# Patient Record
Sex: Female | Born: 1935 | Race: Black or African American | Hispanic: No | Smoking: Former smoker
Health system: Southern US, Community
[De-identification: ages and names within clinical notes are randomized; demographics above are authoritative.]

## PROBLEM LIST (undated history)

## (undated) DIAGNOSIS — K589 Irritable bowel syndrome without diarrhea: Secondary | ICD-10-CM

## (undated) DIAGNOSIS — E785 Hyperlipidemia, unspecified: Secondary | ICD-10-CM

## (undated) DIAGNOSIS — G8929 Other chronic pain: Secondary | ICD-10-CM

## (undated) DIAGNOSIS — F0390 Unspecified dementia without behavioral disturbance: Secondary | ICD-10-CM

## (undated) DIAGNOSIS — C50919 Malignant neoplasm of unspecified site of unspecified female breast: Secondary | ICD-10-CM

## (undated) DIAGNOSIS — M549 Dorsalgia, unspecified: Secondary | ICD-10-CM

## (undated) DIAGNOSIS — M542 Cervicalgia: Secondary | ICD-10-CM

## (undated) DIAGNOSIS — H269 Unspecified cataract: Secondary | ICD-10-CM

## (undated) HISTORY — PX: MASTECTOMY: SHX3

---

## 2003-07-22 ENCOUNTER — Emergency Department (HOSPITAL_COMMUNITY): Admission: EM | Admit: 2003-07-22 | Discharge: 2003-07-22 | Payer: Self-pay | Admitting: Emergency Medicine

## 2004-06-15 ENCOUNTER — Ambulatory Visit (HOSPITAL_COMMUNITY): Admission: RE | Admit: 2004-06-15 | Discharge: 2004-06-15 | Payer: Self-pay | Admitting: Family Medicine

## 2005-12-02 ENCOUNTER — Emergency Department (HOSPITAL_COMMUNITY): Admission: EM | Admit: 2005-12-02 | Discharge: 2005-12-02 | Payer: Self-pay | Admitting: Emergency Medicine

## 2007-01-15 ENCOUNTER — Telehealth (INDEPENDENT_AMBULATORY_CARE_PROVIDER_SITE_OTHER): Payer: Self-pay | Admitting: *Deleted

## 2007-01-15 ENCOUNTER — Ambulatory Visit: Payer: Self-pay | Admitting: Family Medicine

## 2007-01-15 DIAGNOSIS — K589 Irritable bowel syndrome without diarrhea: Secondary | ICD-10-CM | POA: Insufficient documentation

## 2007-01-15 DIAGNOSIS — K219 Gastro-esophageal reflux disease without esophagitis: Secondary | ICD-10-CM | POA: Insufficient documentation

## 2007-01-15 DIAGNOSIS — G609 Hereditary and idiopathic neuropathy, unspecified: Secondary | ICD-10-CM | POA: Insufficient documentation

## 2007-01-15 DIAGNOSIS — H269 Unspecified cataract: Secondary | ICD-10-CM | POA: Insufficient documentation

## 2007-01-15 DIAGNOSIS — F411 Generalized anxiety disorder: Secondary | ICD-10-CM | POA: Insufficient documentation

## 2007-01-15 DIAGNOSIS — Z853 Personal history of malignant neoplasm of breast: Secondary | ICD-10-CM

## 2007-01-15 DIAGNOSIS — M129 Arthropathy, unspecified: Secondary | ICD-10-CM | POA: Insufficient documentation

## 2007-01-15 DIAGNOSIS — J45909 Unspecified asthma, uncomplicated: Secondary | ICD-10-CM | POA: Insufficient documentation

## 2007-01-15 DIAGNOSIS — K5900 Constipation, unspecified: Secondary | ICD-10-CM | POA: Insufficient documentation

## 2007-01-15 DIAGNOSIS — J309 Allergic rhinitis, unspecified: Secondary | ICD-10-CM | POA: Insufficient documentation

## 2007-01-21 ENCOUNTER — Telehealth (INDEPENDENT_AMBULATORY_CARE_PROVIDER_SITE_OTHER): Payer: Self-pay | Admitting: Family Medicine

## 2007-01-21 ENCOUNTER — Ambulatory Visit (HOSPITAL_COMMUNITY): Admission: RE | Admit: 2007-01-21 | Discharge: 2007-01-21 | Payer: Self-pay | Admitting: Family Medicine

## 2007-01-21 ENCOUNTER — Encounter (INDEPENDENT_AMBULATORY_CARE_PROVIDER_SITE_OTHER): Payer: Self-pay | Admitting: Family Medicine

## 2007-01-22 ENCOUNTER — Telehealth (INDEPENDENT_AMBULATORY_CARE_PROVIDER_SITE_OTHER): Payer: Self-pay | Admitting: *Deleted

## 2007-01-22 LAB — CONVERTED CEMR LAB
ALT: 13 units/L (ref 0–35)
AST: 24 units/L (ref 0–37)
Albumin: 4.2 g/dL (ref 3.5–5.2)
Alkaline Phosphatase: 65 units/L (ref 39–117)
BUN: 16 mg/dL (ref 6–23)
CO2: 29 meq/L (ref 19–32)
Calcium: 9.2 mg/dL (ref 8.4–10.5)
Chloride: 104 meq/L (ref 96–112)
Cholesterol: 175 mg/dL (ref 0–200)
Creatinine, Ser: 0.68 mg/dL (ref 0.40–1.20)
Glucose, Bld: 80 mg/dL (ref 70–99)
HDL: 55 mg/dL (ref >39)
LDL Cholesterol: 101 mg/dL — ABNORMAL HIGH (ref 0–99)
Potassium: 4.1 meq/L (ref 3.5–5.3)
Sodium: 145 meq/L (ref 135–145)
TSH: 1.49 microintl units/mL (ref 0.350–5.50)
Total Bilirubin: 0.6 mg/dL (ref 0.3–1.2)
Total CHOL/HDL Ratio: 3.2
Total Protein: 7.1 g/dL (ref 6.0–8.3)
Triglycerides: 97 mg/dL (ref <150)
VLDL: 19 mg/dL (ref 0–40)

## 2007-01-26 ENCOUNTER — Encounter (INDEPENDENT_AMBULATORY_CARE_PROVIDER_SITE_OTHER): Payer: Self-pay | Admitting: Family Medicine

## 2007-01-27 ENCOUNTER — Telehealth (INDEPENDENT_AMBULATORY_CARE_PROVIDER_SITE_OTHER): Payer: Self-pay | Admitting: *Deleted

## 2007-01-27 ENCOUNTER — Ambulatory Visit: Payer: Self-pay | Admitting: Family Medicine

## 2007-01-27 LAB — CONVERTED CEMR LAB
Basophils Absolute: 0 10*3/uL (ref 0.0–0.1)
Basophils Relative: 1 % (ref 0–1)
Cholesterol, target level: 200 mg/dL
Eosinophils Absolute: 0.1 10*3/uL (ref 0.0–0.7)
Eosinophils Relative: 4 % (ref 0–5)
HCT: 43.3 % (ref 36.0–46.0)
HDL goal, serum: 40 mg/dL
Hemoglobin: 14 g/dL (ref 12.0–15.0)
LDL Goal: 160 mg/dL
Lymphocytes Relative: 42 % (ref 12–46)
Lymphs Abs: 1.4 10*3/uL (ref 0.7–3.3)
MCHC: 32.3 g/dL (ref 30.0–36.0)
MCV: 98.4 fL (ref 78.0–100.0)
Monocytes Absolute: 0.5 10*3/uL (ref 0.2–0.7)
Monocytes Relative: 14 % — ABNORMAL HIGH (ref 3–11)
Neutro Abs: 1.3 10*3/uL — ABNORMAL LOW (ref 1.7–7.7)
Neutrophils Relative %: 40 % — ABNORMAL LOW (ref 43–77)
Platelets: 202 10*3/uL (ref 150–400)
RBC: 4.4 M/uL (ref 3.87–5.11)
RDW: 13.6 % (ref 11.5–14.0)
WBC: 3.3 10*3/uL — ABNORMAL LOW (ref 4.0–10.5)

## 2007-01-28 ENCOUNTER — Ambulatory Visit (HOSPITAL_COMMUNITY): Admission: RE | Admit: 2007-01-28 | Discharge: 2007-01-28 | Payer: Self-pay | Admitting: Family Medicine

## 2007-01-29 ENCOUNTER — Telehealth (INDEPENDENT_AMBULATORY_CARE_PROVIDER_SITE_OTHER): Payer: Self-pay | Admitting: Family Medicine

## 2007-02-10 ENCOUNTER — Ambulatory Visit: Payer: Self-pay | Admitting: Family Medicine

## 2007-02-10 ENCOUNTER — Telehealth (INDEPENDENT_AMBULATORY_CARE_PROVIDER_SITE_OTHER): Payer: Self-pay | Admitting: *Deleted

## 2007-02-11 ENCOUNTER — Encounter (INDEPENDENT_AMBULATORY_CARE_PROVIDER_SITE_OTHER): Payer: Self-pay | Admitting: Family Medicine

## 2007-02-16 ENCOUNTER — Ambulatory Visit (HOSPITAL_COMMUNITY): Admission: RE | Admit: 2007-02-16 | Discharge: 2007-02-16 | Payer: Self-pay | Admitting: Family Medicine

## 2007-02-17 ENCOUNTER — Encounter (INDEPENDENT_AMBULATORY_CARE_PROVIDER_SITE_OTHER): Payer: Self-pay | Admitting: Family Medicine

## 2007-02-17 ENCOUNTER — Telehealth (INDEPENDENT_AMBULATORY_CARE_PROVIDER_SITE_OTHER): Payer: Self-pay | Admitting: *Deleted

## 2007-02-18 ENCOUNTER — Telehealth (INDEPENDENT_AMBULATORY_CARE_PROVIDER_SITE_OTHER): Payer: Self-pay | Admitting: *Deleted

## 2007-02-18 ENCOUNTER — Encounter (INDEPENDENT_AMBULATORY_CARE_PROVIDER_SITE_OTHER): Payer: Self-pay | Admitting: Family Medicine

## 2007-02-18 DIAGNOSIS — M4800 Spinal stenosis, site unspecified: Secondary | ICD-10-CM | POA: Insufficient documentation

## 2007-11-26 IMAGING — CR DG CERVICAL SPINE COMPLETE 4+V
7 series · 7 of 7 positions shown · non-contrast
Comparison: none

CLINICAL DATA: Neck pain.  Right arm radiculopathy.  
 CERVICAL SPINE ? 5 VIEW:

[view not recorded (1 of 7)]
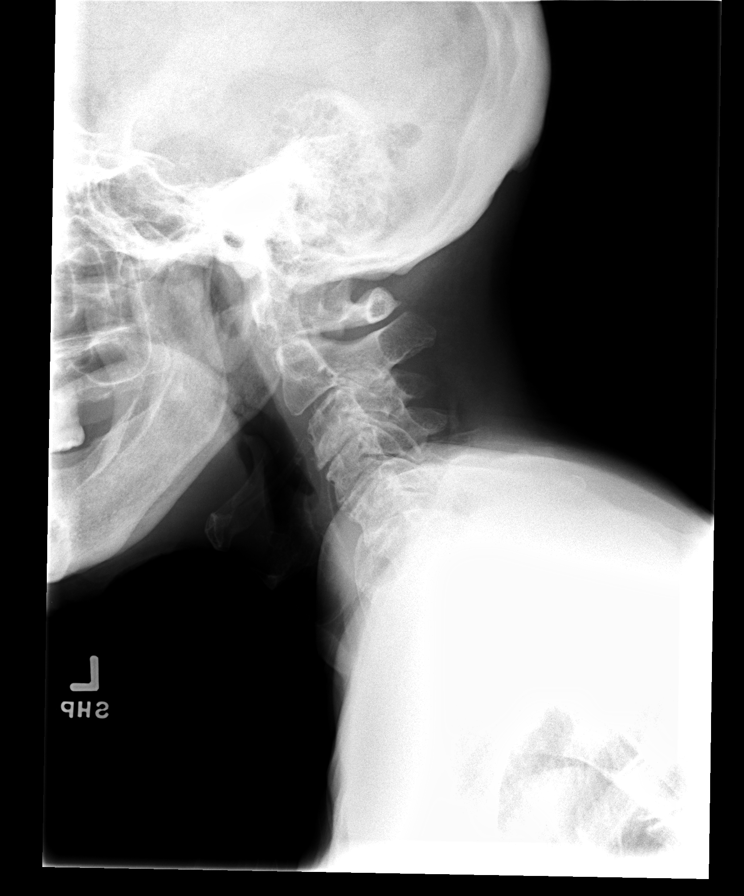

[view not recorded (2 of 7)]
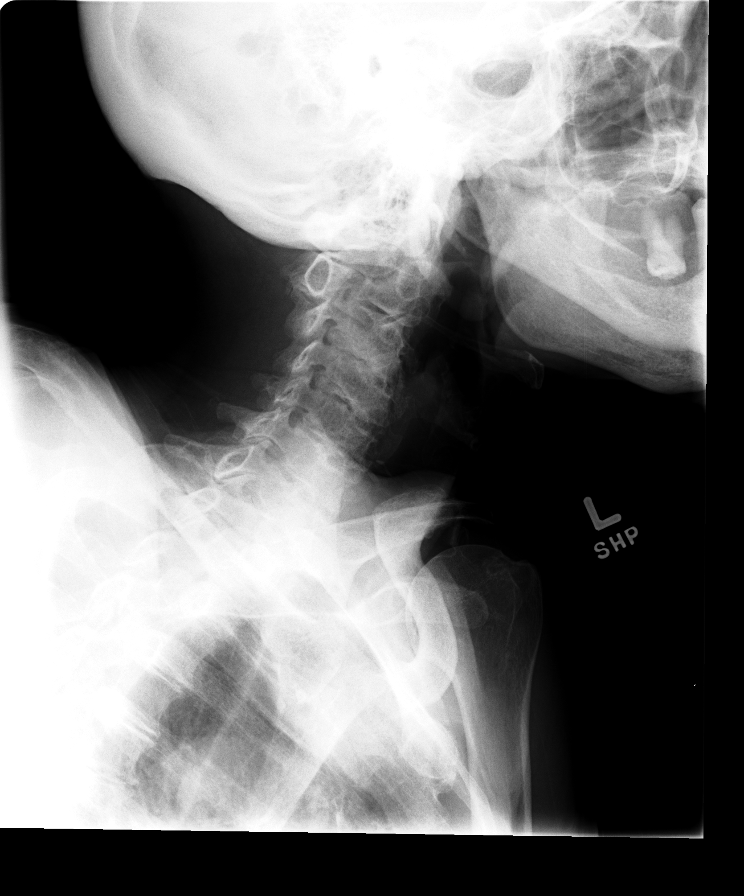

[view not recorded (3 of 7)]
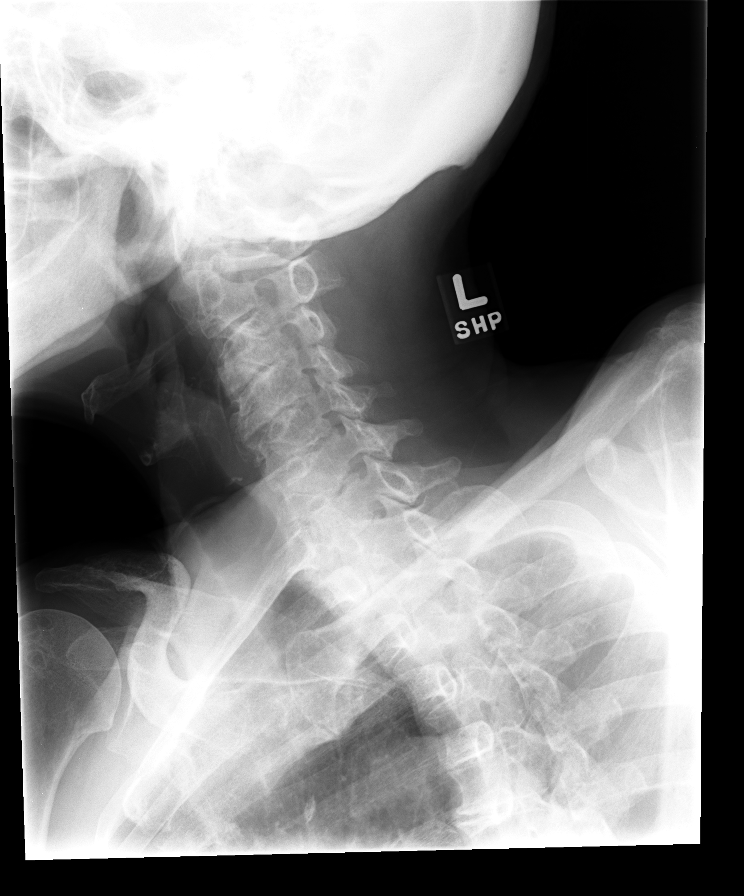

[view not recorded (4 of 7)]
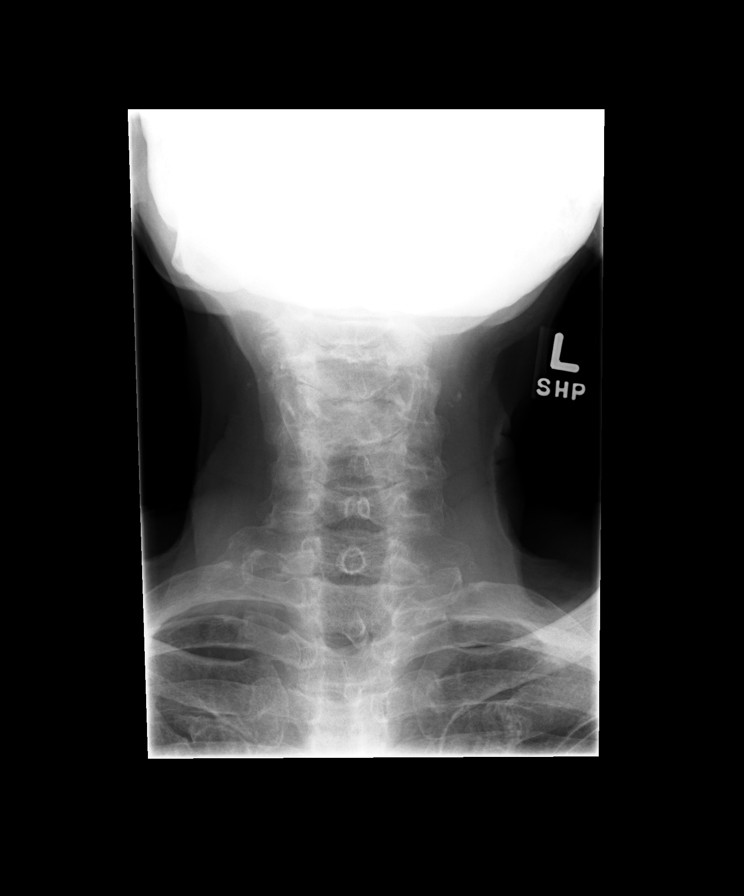

[view not recorded (5 of 7)]
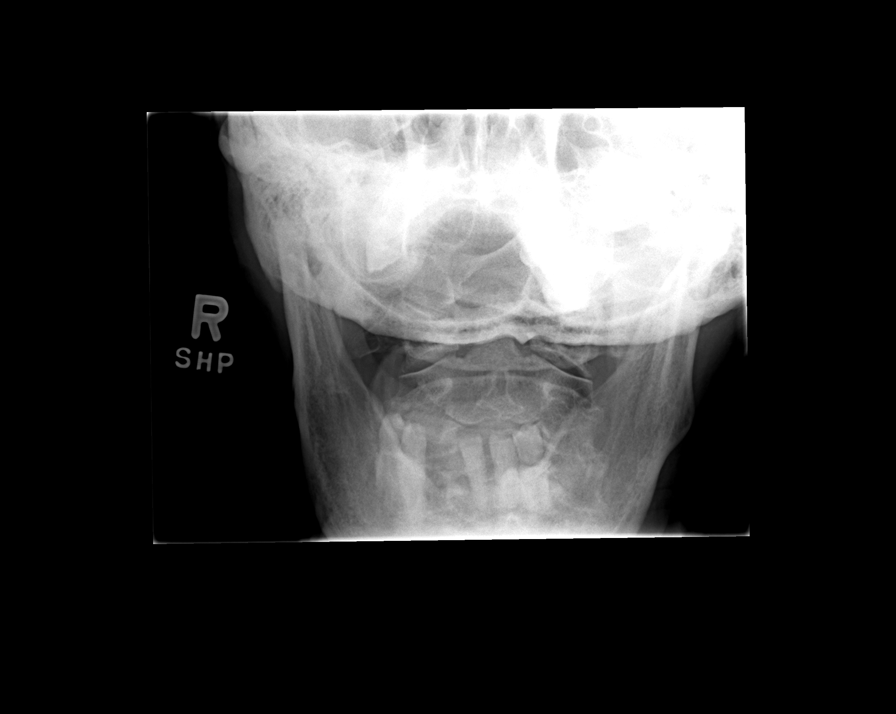

[view not recorded (6 of 7)]
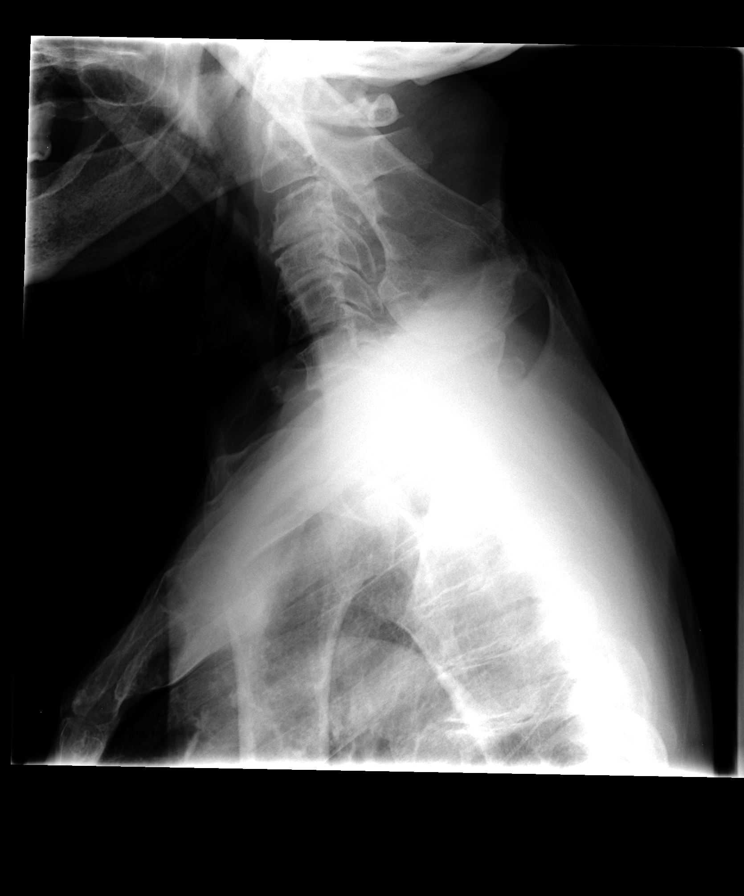

[view not recorded (7 of 7)]
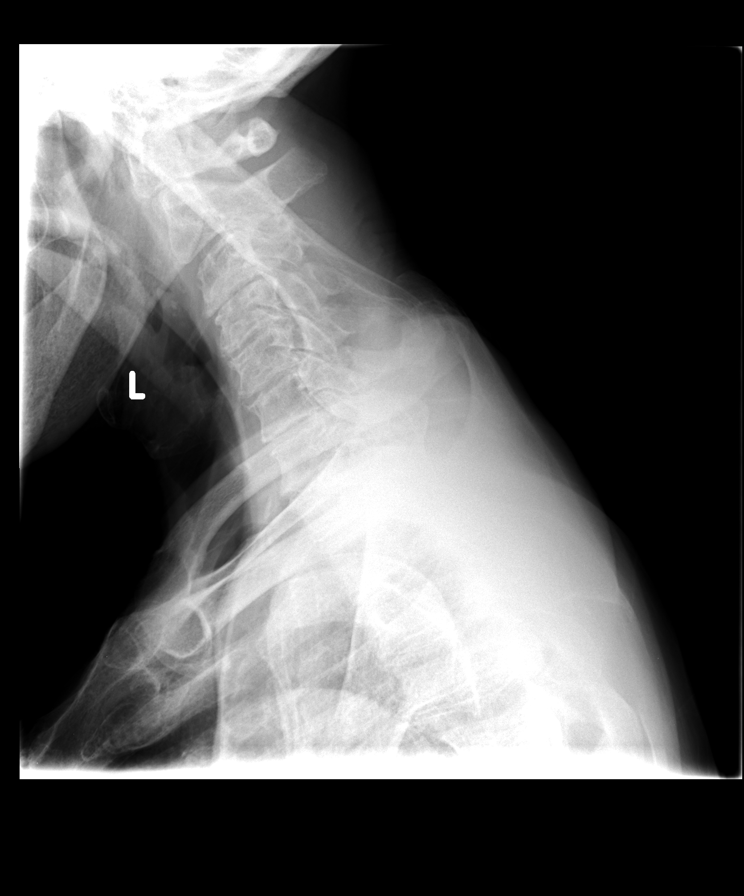

[7 of 7 positions shown; findings below may reference images not displayed]

FINDINGS: There is straightening of the cervical spine.  There is degenerative spondylosis from C2-3 through C7-T1 with disk space narrowing and posteriorly projecting osteophytes.  This is most pronounced at C3-4.  There is osteophytic encroachment upon the foramina bilaterally, most pronounced at C3-4 and C4-5.
IMPRESSION: Cervical spondylosis most pronounced at C3-4 without osteophytic encroachment upon the spinal canal and neural foramina.  Lesser but potentially significant changes also at C2-3 and C4-5.

## 2008-01-11 ENCOUNTER — Ambulatory Visit (HOSPITAL_COMMUNITY): Admission: RE | Admit: 2008-01-11 | Discharge: 2008-01-11 | Payer: Self-pay | Admitting: Family Medicine

## 2008-01-18 ENCOUNTER — Encounter (INDEPENDENT_AMBULATORY_CARE_PROVIDER_SITE_OTHER): Payer: Self-pay | Admitting: Family Medicine

## 2008-01-28 ENCOUNTER — Ambulatory Visit: Payer: Self-pay | Admitting: Family Medicine

## 2008-01-28 LAB — CONVERTED CEMR LAB

## 2008-01-29 ENCOUNTER — Encounter (INDEPENDENT_AMBULATORY_CARE_PROVIDER_SITE_OTHER): Payer: Self-pay | Admitting: Family Medicine

## 2008-02-01 LAB — CONVERTED CEMR LAB
ALT: 13 units/L (ref 0–35)
AST: 27 units/L (ref 0–37)
Albumin: 4.2 g/dL (ref 3.5–5.2)
Alkaline Phosphatase: 53 units/L (ref 39–117)
BUN: 20 mg/dL (ref 6–23)
Basophils Absolute: 0 10*3/uL (ref 0.0–0.1)
Basophils Relative: 1 % (ref 0–1)
CO2: 21 meq/L (ref 19–32)
Calcium: 9.2 mg/dL (ref 8.4–10.5)
Chloride: 108 meq/L (ref 96–112)
Creatinine, Ser: 0.78 mg/dL (ref 0.40–1.20)
Eosinophils Absolute: 0.1 10*3/uL (ref 0.0–0.7)
Eosinophils Relative: 4 % (ref 0–5)
Glucose, Bld: 70 mg/dL (ref 70–99)
HCT: 40.8 % (ref 36.0–46.0)
Hemoglobin: 13.5 g/dL (ref 12.0–15.0)
Lymphocytes Relative: 39 % (ref 12–46)
Lymphs Abs: 1.2 10*3/uL (ref 0.7–4.0)
MCHC: 33.1 g/dL (ref 30.0–36.0)
MCV: 96.2 fL (ref 78.0–100.0)
Monocytes Absolute: 0.4 10*3/uL (ref 0.1–1.0)
Monocytes Relative: 15 % — ABNORMAL HIGH (ref 3–12)
Neutro Abs: 1.3 10*3/uL — ABNORMAL LOW (ref 1.7–7.7)
Neutrophils Relative %: 42 % — ABNORMAL LOW (ref 43–77)
Platelets: 159 10*3/uL (ref 150–400)
Potassium: 4.5 meq/L (ref 3.5–5.3)
RBC: 4.24 M/uL (ref 3.87–5.11)
RDW: 14 % (ref 11.5–15.5)
Sodium: 145 meq/L (ref 135–145)
TSH: 1.329 microintl units/mL (ref 0.350–4.50)
Total Bilirubin: 0.5 mg/dL (ref 0.3–1.2)
Total Protein: 7 g/dL (ref 6.0–8.3)
WBC: 3 10*3/uL — ABNORMAL LOW (ref 4.0–10.5)

## 2008-02-11 ENCOUNTER — Ambulatory Visit: Payer: Self-pay | Admitting: Family Medicine

## 2008-02-11 DIAGNOSIS — H9190 Unspecified hearing loss, unspecified ear: Secondary | ICD-10-CM | POA: Insufficient documentation

## 2008-02-12 ENCOUNTER — Telehealth (INDEPENDENT_AMBULATORY_CARE_PROVIDER_SITE_OTHER): Payer: Self-pay | Admitting: *Deleted

## 2008-05-12 ENCOUNTER — Ambulatory Visit: Payer: Self-pay | Admitting: Family Medicine

## 2008-08-11 ENCOUNTER — Encounter (INDEPENDENT_AMBULATORY_CARE_PROVIDER_SITE_OTHER): Payer: Self-pay | Admitting: Family Medicine

## 2008-09-06 ENCOUNTER — Ambulatory Visit: Payer: Self-pay | Admitting: Family Medicine

## 2008-09-06 DIAGNOSIS — F4321 Adjustment disorder with depressed mood: Secondary | ICD-10-CM | POA: Insufficient documentation

## 2008-09-08 ENCOUNTER — Encounter (INDEPENDENT_AMBULATORY_CARE_PROVIDER_SITE_OTHER): Payer: Self-pay | Admitting: Family Medicine

## 2008-09-12 ENCOUNTER — Encounter (INDEPENDENT_AMBULATORY_CARE_PROVIDER_SITE_OTHER): Payer: Self-pay | Admitting: Family Medicine

## 2008-10-10 ENCOUNTER — Ambulatory Visit: Payer: Self-pay | Admitting: Family Medicine

## 2009-01-10 ENCOUNTER — Ambulatory Visit: Payer: Self-pay | Admitting: Family Medicine

## 2009-01-11 ENCOUNTER — Encounter (INDEPENDENT_AMBULATORY_CARE_PROVIDER_SITE_OTHER): Payer: Self-pay | Admitting: *Deleted

## 2009-01-11 LAB — CONVERTED CEMR LAB
ALT: 42 units/L — ABNORMAL HIGH (ref 0–35)
AST: 34 units/L (ref 0–37)
Albumin: 4.3 g/dL (ref 3.5–5.2)
Alkaline Phosphatase: 68 units/L (ref 39–117)
BUN: 13 mg/dL (ref 6–23)
Basophils Absolute: 0 10*3/uL (ref 0.0–0.1)
Basophils Relative: 0 % (ref 0–1)
CO2: 25 meq/L (ref 19–32)
Calcium: 9.5 mg/dL (ref 8.4–10.5)
Chloride: 104 meq/L (ref 96–112)
Creatinine, Ser: 0.7 mg/dL (ref 0.40–1.20)
Eosinophils Absolute: 0.1 10*3/uL (ref 0.0–0.7)
Eosinophils Relative: 3 % (ref 0–5)
Glucose, Bld: 112 mg/dL — ABNORMAL HIGH (ref 70–99)
HCT: 37.8 % (ref 36.0–46.0)
Hemoglobin: 12.6 g/dL (ref 12.0–15.0)
Lymphocytes Relative: 47 % — ABNORMAL HIGH (ref 12–46)
Lymphs Abs: 1.5 10*3/uL (ref 0.7–4.0)
MCHC: 33.3 g/dL (ref 30.0–36.0)
MCV: 94.5 fL (ref 78.0–100.0)
Monocytes Absolute: 0.3 10*3/uL (ref 0.1–1.0)
Monocytes Relative: 8 % (ref 3–12)
Neutro Abs: 1.3 10*3/uL — ABNORMAL LOW (ref 1.7–7.7)
Neutrophils Relative %: 42 % — ABNORMAL LOW (ref 43–77)
Platelets: 153 10*3/uL (ref 150–400)
Potassium: 4.1 meq/L (ref 3.5–5.3)
RBC: 4 M/uL (ref 3.87–5.11)
RDW: 13.3 % (ref 11.5–15.5)
Sodium: 143 meq/L (ref 135–145)
TSH: 0.962 microintl units/mL (ref 0.350–4.500)
Total Bilirubin: 0.3 mg/dL (ref 0.3–1.2)
Total Protein: 6.9 g/dL (ref 6.0–8.3)
WBC: 3.1 10*3/uL — ABNORMAL LOW (ref 4.0–10.5)

## 2009-01-12 ENCOUNTER — Encounter (INDEPENDENT_AMBULATORY_CARE_PROVIDER_SITE_OTHER): Payer: Self-pay | Admitting: Family Medicine

## 2009-01-17 ENCOUNTER — Encounter (INDEPENDENT_AMBULATORY_CARE_PROVIDER_SITE_OTHER): Payer: Self-pay | Admitting: *Deleted

## 2009-04-04 ENCOUNTER — Ambulatory Visit: Payer: Self-pay | Admitting: Family Medicine

## 2009-04-04 DIAGNOSIS — D72819 Decreased white blood cell count, unspecified: Secondary | ICD-10-CM | POA: Insufficient documentation

## 2009-04-04 DIAGNOSIS — R740 Nonspecific elevation of levels of transaminase and lactic acid dehydrogenase [LDH]: Secondary | ICD-10-CM

## 2009-04-05 ENCOUNTER — Encounter (INDEPENDENT_AMBULATORY_CARE_PROVIDER_SITE_OTHER): Payer: Self-pay | Admitting: Family Medicine

## 2009-04-06 LAB — CONVERTED CEMR LAB
ALT: 13 units/L (ref 0–35)
AST: 23 units/L (ref 0–37)
Albumin: 4 g/dL (ref 3.5–5.2)
Alkaline Phosphatase: 61 units/L (ref 39–117)
Basophils Absolute: 0 10*3/uL (ref 0.0–0.1)
Basophils Relative: 1 % (ref 0–1)
Bilirubin, Direct: 0.1 mg/dL (ref 0.0–0.3)
Eosinophils Absolute: 0.1 10*3/uL (ref 0.0–0.7)
Eosinophils Relative: 3 % (ref 0–5)
HCT: 38 % (ref 36.0–46.0)
Hemoglobin: 12.9 g/dL (ref 12.0–15.0)
Indirect Bilirubin: 0.3 mg/dL (ref 0.0–0.9)
Lymphocytes Relative: 50 % — ABNORMAL HIGH (ref 12–46)
Lymphs Abs: 1.7 10*3/uL (ref 0.7–4.0)
MCHC: 33.9 g/dL (ref 30.0–36.0)
MCV: 93.4 fL (ref 78.0–100.0)
Monocytes Absolute: 0.4 10*3/uL (ref 0.1–1.0)
Monocytes Relative: 12 % (ref 3–12)
Neutro Abs: 1.2 10*3/uL — ABNORMAL LOW (ref 1.7–7.7)
Neutrophils Relative %: 35 % — ABNORMAL LOW (ref 43–77)
Platelets: 158 10*3/uL (ref 150–400)
RBC: 4.07 M/uL (ref 3.87–5.11)
RDW: 13.1 % (ref 11.5–15.5)
Total Bilirubin: 0.4 mg/dL (ref 0.3–1.2)
Total Protein: 6.7 g/dL (ref 6.0–8.3)
WBC: 3.4 10*3/uL — ABNORMAL LOW (ref 4.0–10.5)

## 2009-04-28 ENCOUNTER — Encounter (HOSPITAL_COMMUNITY): Admission: RE | Admit: 2009-04-28 | Discharge: 2009-05-28 | Payer: Self-pay | Admitting: Oncology

## 2009-04-28 ENCOUNTER — Ambulatory Visit (HOSPITAL_COMMUNITY): Payer: Self-pay | Admitting: Oncology

## 2009-11-23 ENCOUNTER — Ambulatory Visit (HOSPITAL_COMMUNITY): Payer: Self-pay | Admitting: Oncology

## 2009-11-23 ENCOUNTER — Encounter (HOSPITAL_COMMUNITY): Admission: RE | Admit: 2009-11-23 | Discharge: 2009-12-23 | Payer: Self-pay | Admitting: Oncology

## 2010-08-04 ENCOUNTER — Encounter: Payer: Self-pay | Admitting: Family Medicine

## 2010-08-05 ENCOUNTER — Encounter: Payer: Self-pay | Admitting: Family Medicine

## 2010-08-06 ENCOUNTER — Encounter: Payer: Self-pay | Admitting: Family Medicine

## 2010-10-02 LAB — DIFFERENTIAL
Basophils Absolute: 0 10*3/uL (ref 0.0–0.1)
Basophils Relative: 1 % (ref 0–1)
Eosinophils Absolute: 0.2 10*3/uL (ref 0.0–0.7)
Eosinophils Relative: 5 % (ref 0–5)
Lymphocytes Relative: 43 % (ref 12–46)
Lymphs Abs: 1.5 10*3/uL (ref 0.7–4.0)
Monocytes Absolute: 0.5 10*3/uL (ref 0.1–1.0)
Monocytes Relative: 13 % — ABNORMAL HIGH (ref 3–12)
Neutro Abs: 1.3 10*3/uL — ABNORMAL LOW (ref 1.7–7.7)
Neutrophils Relative %: 38 % — ABNORMAL LOW (ref 43–77)

## 2010-10-02 LAB — CBC
HCT: 38.1 % (ref 36.0–46.0)
Hemoglobin: 13.4 g/dL (ref 12.0–15.0)
MCHC: 35.2 g/dL (ref 30.0–36.0)
MCV: 93.9 fL (ref 78.0–100.0)
Platelets: 187 10*3/uL (ref 150–400)
RBC: 4.06 MIL/uL (ref 3.87–5.11)
RDW: 13.6 % (ref 11.5–15.5)
WBC: 3.5 10*3/uL — ABNORMAL LOW (ref 4.0–10.5)

## 2010-10-18 LAB — CBC
MCHC: 34.6 g/dL (ref 30.0–36.0)
RBC: 4.39 MIL/uL (ref 3.87–5.11)
RDW: 13.7 % (ref 11.5–15.5)

## 2010-10-18 LAB — DIFFERENTIAL
Basophils Absolute: 0 10*3/uL (ref 0.0–0.1)
Basophils Relative: 0 % (ref 0–1)
Lymphocytes Relative: 12 % (ref 12–46)
Monocytes Relative: 11 % (ref 3–12)
Neutro Abs: 4.4 10*3/uL (ref 1.7–7.7)
Neutrophils Relative %: 75 % (ref 43–77)

## 2010-10-18 LAB — TSH: TSH: 0.774 u[IU]/mL (ref 0.350–4.500)

## 2010-10-18 LAB — RPR: RPR Ser Ql: NONREACTIVE

## 2010-10-18 LAB — VITAMIN B12: Vitamin B-12: 827 pg/mL (ref 211–911)

## 2011-09-13 ENCOUNTER — Telehealth: Payer: Self-pay | Admitting: *Deleted

## 2011-09-13 NOTE — Telephone Encounter (Signed)
Waiting on Md input from call of pts spouse 12:28 PM

## 2014-07-14 ENCOUNTER — Ambulatory Visit (INDEPENDENT_AMBULATORY_CARE_PROVIDER_SITE_OTHER): Payer: Medicare Other | Admitting: Otolaryngology

## 2014-07-14 DIAGNOSIS — H6123 Impacted cerumen, bilateral: Secondary | ICD-10-CM

## 2014-07-14 DIAGNOSIS — H903 Sensorineural hearing loss, bilateral: Secondary | ICD-10-CM

## 2014-11-11 ENCOUNTER — Inpatient Hospital Stay (HOSPITAL_COMMUNITY)
Admission: EM | Admit: 2014-11-11 | Discharge: 2014-11-16 | DRG: 948 | Disposition: A | Discharge status: Skilled Nursing Facility | Payer: Medicare Other | Attending: Family Medicine | Admitting: Family Medicine

## 2014-11-11 ENCOUNTER — Emergency Department (HOSPITAL_COMMUNITY): Payer: Medicare Other

## 2014-11-11 ENCOUNTER — Encounter (HOSPITAL_COMMUNITY): Payer: Self-pay

## 2014-11-11 DIAGNOSIS — R945 Abnormal results of liver function studies: Secondary | ICD-10-CM

## 2014-11-11 DIAGNOSIS — Z853 Personal history of malignant neoplasm of breast: Secondary | ICD-10-CM

## 2014-11-11 DIAGNOSIS — Z800 Family history of malignant neoplasm of digestive organs: Secondary | ICD-10-CM

## 2014-11-11 DIAGNOSIS — Z87891 Personal history of nicotine dependence: Secondary | ICD-10-CM

## 2014-11-11 DIAGNOSIS — D696 Thrombocytopenia, unspecified: Secondary | ICD-10-CM | POA: Diagnosis present

## 2014-11-11 DIAGNOSIS — K529 Noninfective gastroenteritis and colitis, unspecified: Secondary | ICD-10-CM | POA: Diagnosis present

## 2014-11-11 DIAGNOSIS — R4182 Altered mental status, unspecified: Secondary | ICD-10-CM | POA: Diagnosis not present

## 2014-11-11 DIAGNOSIS — K8020 Calculus of gallbladder without cholecystitis without obstruction: Secondary | ICD-10-CM | POA: Diagnosis present

## 2014-11-11 DIAGNOSIS — K2100 Gastro-esophageal reflux disease with esophagitis, without bleeding: Secondary | ICD-10-CM

## 2014-11-11 DIAGNOSIS — K7589 Other specified inflammatory liver diseases: Secondary | ICD-10-CM

## 2014-11-11 DIAGNOSIS — R509 Fever, unspecified: Secondary | ICD-10-CM | POA: Diagnosis present

## 2014-11-11 DIAGNOSIS — R7989 Other specified abnormal findings of blood chemistry: Secondary | ICD-10-CM | POA: Insufficient documentation

## 2014-11-11 DIAGNOSIS — E785 Hyperlipidemia, unspecified: Secondary | ICD-10-CM | POA: Diagnosis present

## 2014-11-11 HISTORY — DX: Malignant neoplasm of unspecified site of unspecified female breast: C50.919

## 2014-11-11 HISTORY — DX: Hyperlipidemia, unspecified: E78.5

## 2014-11-11 LAB — URINALYSIS, ROUTINE W REFLEX MICROSCOPIC
Glucose, UA: NEGATIVE mg/dL
Ketones, ur: NEGATIVE mg/dL
LEUKOCYTES UA: NEGATIVE
Nitrite: NEGATIVE
PH: 8 (ref 5.0–8.0)
Protein, ur: 30 mg/dL — AB
Specific Gravity, Urine: 1.02 (ref 1.005–1.030)
UROBILINOGEN UA: 4 mg/dL — AB (ref 0.0–1.0)

## 2014-11-11 LAB — BASIC METABOLIC PANEL
ANION GAP: 7 (ref 5–15)
BUN: 15 mg/dL (ref 6–23)
CALCIUM: 9.3 mg/dL (ref 8.4–10.5)
CO2: 29 mmol/L (ref 19–32)
CREATININE: 0.6 mg/dL (ref 0.50–1.10)
Chloride: 104 mmol/L (ref 96–112)
GFR calc non Af Amer: 80 mL/min — ABNORMAL LOW (ref >90)
Glucose, Bld: 139 mg/dL — ABNORMAL HIGH (ref 70–99)
POTASSIUM: 4.4 mmol/L (ref 3.5–5.1)
SODIUM: 140 mmol/L (ref 135–145)

## 2014-11-11 LAB — URINE MICROSCOPIC-ADD ON

## 2014-11-11 NOTE — ED Notes (Signed)
Patient resting quietly in bed with eyes closed.  Patient's son called to check on her.

## 2014-11-11 NOTE — ED Notes (Signed)
Pt in by ems from [facility] assisted [facility] facility.   Per staff members, pt with change in mental status, more confused today.  Pt is awake and alert.

## 2014-11-11 NOTE — ED Provider Notes (Addendum)
CSN: [id number]     Arrival date & time 11/11/14  2231 History   First MD Initiated Contact with Patient 11/11/14 2252     Chief Complaint  Patient presents with  . Altered Mental Status     (Consider location/radiation/quality/duration/timing/severity/associated sxs/prior Treatment) HPI  Level 5 Caveat: altered mental status. This is an 81 year old female who was sent from her assisted living facility because she was more confused today than her baseline. The facility did not report what her baseline mental status is. She was noted to have a temperature of 99.9 orally on arrival. She was noted by nursing staff to be soiled with feces on arrival.  Past Medical History  Diagnosis Date  . Hyperlipidemia   . Cancer   . Breast cancer    Past Surgical History  Procedure Laterality Date  . Mastectomy     No family history on file. History  Substance Use Topics  . Smoking status: Former Research scientist (life sciences)  . Smokeless tobacco: Not on file  . Alcohol Use: No   OB History    No data available     Review of Systems  Unable to perform ROS   Allergies  Aspirin and Penicillins  Home Medications   Prior to Admission medications   Medication Sig Start Date End Date Taking? Authorizing Provider  omeprazole (PRILOSEC) 20 MG capsule Take 20 mg by mouth daily. 10/20/14   Historical Provider, MD  pravastatin (PRAVACHOL) 40 MG tablet Take 40 mg by mouth daily. 10/20/14   Historical Provider, MD   BP 135/59 mmHg  Pulse 84  Temp(Src) 101.2 F (38.4 C) (Rectal)  Resp 16  Wt 125 lb (56.7 kg)  SpO2 98%   Physical Exam  General: Well-developed, thin female in no acute distress; appearance consistent with age of record HENT: normocephalic; atraumatic Eyes: pupils equal, round and reactive to light; arcus senilis bilaterally Neck: supple Heart: regular rate and rhythm Lungs: clear to auscultation bilaterally Abdomen: soft; nondistended; nontender; no masses or hepatosplenomegaly; bowel sounds  present Extremities: Arthritic changes; pulses normal Neurologic: Awake, alert and oriented x 1; inappropriate answers to questions; motor function intact in all extremities and symmetric; no facial droop Skin: Warm and dry Psychiatric: Flat affect    ED Course  Procedures (including critical care time)   MDM   Nursing notes and vitals signs, including pulse oximetry, reviewed.  Summary of this visit's results, reviewed by myself:  Labs:  Results for orders placed or performed during the hospital encounter of 11/11/14 (from the past 24 hour(s))  CBC with Differential/Platelet     Status: Abnormal   Collection Time: 11/11/14 11:14 PM  Result Value Ref Range   WBC 7.7 4.0 - 10.5 K/uL   RBC 3.89 3.87 - 5.11 MIL/uL   Hemoglobin 12.4 12.0 - 15.0 g/dL   HCT 37.4 36.0 - 46.0 %   MCV 96.1 78.0 - 100.0 fL   MCH 31.9 26.0 - 34.0 pg   MCHC 33.2 30.0 - 36.0 g/dL   RDW 13.3 11.5 - 15.5 %   Platelets 35 (L) 150 - 400 K/uL   Neutrophils Relative % 83 (H) 43 - 77 %   Neutro Abs 6.6 1.7 - 7.7 K/uL   Lymphocytes Relative 4 (L) 12 - 46 %   Lymphs Abs 0.3 (L) 0.7 - 4.0 K/uL   Monocytes Relative 13 (H) 3 - 12 %   Monocytes Absolute 1.0 0.1 - 1.0 K/uL   Eosinophils Relative 0 0 - 5 %   Eosinophils  Absolute 0.0 0.0 - 0.7 K/uL   Basophils Relative 0 0 - 1 %   Basophils Absolute 0.0 0.0 - 0.1 K/uL  Basic metabolic panel     Status: Abnormal   Collection Time: 11/11/14 11:14 PM  Result Value Ref Range   Sodium 140 135 - 145 mmol/L   Potassium 4.4 3.5 - 5.1 mmol/L   Chloride 104 96 - 112 mmol/L   CO2 29 19 - 32 mmol/L   Glucose, Bld 139 (H) 70 - 99 mg/dL   BUN 15 6 - 23 mg/dL   Creatinine, Ser 0.60 0.50 - 1.10 mg/dL   Calcium 9.3 8.4 - 10.5 mg/dL   GFR calc non Af Amer 80 (L) >90 mL/min   GFR calc Af Amer >90 >90 mL/min   Anion gap 7 5 - 15  Urinalysis, Routine w reflex microscopic     Status: Abnormal   Collection Time: 11/11/14 11:20 PM  Result Value Ref Range   Color, Urine YELLOW  YELLOW   APPearance CLEAR CLEAR   Specific Gravity, Urine 1.020 1.005 - 1.030   pH 8.0 5.0 - 8.0   Glucose, UA NEGATIVE NEGATIVE mg/dL   Hgb urine dipstick TRACE (A) NEGATIVE   Bilirubin Urine SMALL (A) NEGATIVE   Ketones, ur NEGATIVE NEGATIVE mg/dL   Protein, ur 30 (A) NEGATIVE mg/dL   Urobilinogen, UA 4.0 (H) 0.0 - 1.0 mg/dL   Nitrite NEGATIVE NEGATIVE   Leukocytes, UA NEGATIVE NEGATIVE  Urine microscopic-add on     Status: Abnormal   Collection Time: 11/11/14 11:20 PM  Result Value Ref Range   Squamous Epithelial / LPF FEW (A) RARE   WBC, UA 0-2 <3 WBC/hpf   RBC / HPF 3-6 <3 RBC/hpf   Bacteria, UA FEW (A) RARE  Blood culture (routine x 2)     Status: None (Preliminary result)   Collection Time: 11/12/14 12:51 AM  Result Value Ref Range   Specimen Description LEFT ANTECUBITAL    Special Requests BOTTLES DRAWN AEROBIC ONLY 6CC AEB    Culture PENDING    Report Status PENDING   I-Stat CG4 Lactic Acid, ED     Status: Abnormal   Collection Time: 11/12/14  1:06 AM  Result Value Ref Range   Lactic Acid, Venous 2.80 (HH) 0.5 - 2.0 mmol/L   Comment NOTIFIED PHYSICIAN     Imaging Studies: Ct Head Wo Contrast  11/12/2014   CLINICAL DATA:  Acute onset of altered mental status. Initial encounter.  EXAM: CT HEAD WITHOUT CONTRAST  TECHNIQUE: Contiguous axial images were obtained from the base of the skull through the vertex without intravenous contrast.  COMPARISON:  CT of the head performed 07/22/2003  FINDINGS: There is no evidence of acute infarction, mass lesion, or intra- or extra-axial hemorrhage on CT.  Prominence of the ventricles sulci reflects moderate cortical volume loss. Mild periventricular white matter change may reflect small vessel ischemic microangiopathy. Evaluation is mildly suboptimal due to motion artifact.  The brainstem and fourth ventricle are within normal limits. The basal ganglia are unremarkable in appearance. The cerebral hemispheres demonstrate grossly normal  gray-white differentiation. No mass effect or midline shift is seen.  There is no evidence of fracture; visualized osseous structures are unremarkable in appearance. The visualized portions of the orbits are within normal limits. The paranasal sinuses and mastoid air cells are well-aerated. No significant soft tissue abnormalities are seen.  IMPRESSION: 1. No acute intracranial pathology seen on CT. 2. Moderate cortical volume loss and scattered small vessel  ischemic microangiopathy.   Electronically Signed   By: Garald Balding M.D.   On: 11/12/2014 01:39   Dg Chest Port 1 View  11/11/2014   CLINICAL DATA:  Altered mental status.  History of breast cancer.  EXAM: PORTABLE CHEST - 1 VIEW  COMPARISON:  Chest radiograph January 28, 2007  FINDINGS: Cardiac silhouette is normal. Mildly calcified aortic knob. The lungs are clear without pleural effusions or focal consolidations. Trachea projects midline and there is no pneumothorax. Soft tissue planes and included osseous structures are non-suspicious.  IMPRESSION: No acute cardiopulmonary process.   Electronically Signed   By: Elon Alas   On: 11/11/2014 23:53   1:35 AM IV fluid bolus initiated for elevated lactate. Patient has not been hypotensive. Patient has leukocytosis but does have a lymphopenia; this may represent a viral illness.  1:48 AM Patient's mental status has improved after treatment of her fever. Hospitalist will admit.   Shanon Rosser, MD 11/12/14 8309  Shanon Rosser, MD 11/12/14 [phone]

## 2014-11-11 NOTE — ED Notes (Signed)
Patient arrived to ED by EMS. Soiled with extremely large new BM and old dry stool as well. Patient pants soiled placed in patient belonging bag. Patient cleaned up and changed so EDP can examine her.

## 2014-11-12 ENCOUNTER — Encounter (HOSPITAL_COMMUNITY): Payer: Self-pay | Admitting: Internal Medicine

## 2014-11-12 ENCOUNTER — Emergency Department (HOSPITAL_COMMUNITY): Payer: Medicare Other

## 2014-11-12 DIAGNOSIS — E785 Hyperlipidemia, unspecified: Secondary | ICD-10-CM | POA: Diagnosis present

## 2014-11-12 DIAGNOSIS — Z853 Personal history of malignant neoplasm of breast: Secondary | ICD-10-CM | POA: Diagnosis not present

## 2014-11-12 DIAGNOSIS — R4182 Altered mental status, unspecified: Secondary | ICD-10-CM | POA: Diagnosis present

## 2014-11-12 DIAGNOSIS — K529 Noninfective gastroenteritis and colitis, unspecified: Secondary | ICD-10-CM | POA: Diagnosis present

## 2014-11-12 DIAGNOSIS — D696 Thrombocytopenia, unspecified: Secondary | ICD-10-CM

## 2014-11-12 DIAGNOSIS — K8020 Calculus of gallbladder without cholecystitis without obstruction: Secondary | ICD-10-CM | POA: Diagnosis present

## 2014-11-12 DIAGNOSIS — Z87891 Personal history of nicotine dependence: Secondary | ICD-10-CM | POA: Diagnosis not present

## 2014-11-12 DIAGNOSIS — Z800 Family history of malignant neoplasm of digestive organs: Secondary | ICD-10-CM | POA: Diagnosis not present

## 2014-11-12 DIAGNOSIS — R509 Fever, unspecified: Secondary | ICD-10-CM | POA: Diagnosis present

## 2014-11-12 LAB — CBC WITH DIFFERENTIAL/PLATELET
BASOS ABS: 0 10*3/uL (ref 0.0–0.1)
BASOS PCT: 0 % (ref 0–1)
Basophils Absolute: 0 10*3/uL (ref 0.0–0.1)
Basophils Relative: 0 % (ref 0–1)
EOS ABS: 0 10*3/uL (ref 0.0–0.7)
EOS PCT: 0 % (ref 0–5)
Eosinophils Absolute: 0 10*3/uL (ref 0.0–0.7)
Eosinophils Relative: 0 % (ref 0–5)
HCT: 37.4 % (ref 36.0–46.0)
HCT: 36.6 % (ref 36.0–46.0)
Hemoglobin: 12.4 g/dL (ref 12.0–15.0)
Hemoglobin: 12.4 g/dL (ref 12.0–15.0)
LYMPHS PCT: 4 % — AB (ref 12–46)
Lymphocytes Relative: 7 % — ABNORMAL LOW (ref 12–46)
Lymphs Abs: 0.3 10*3/uL — ABNORMAL LOW (ref 0.7–4.0)
Lymphs Abs: 0.6 10*3/uL — ABNORMAL LOW (ref 0.7–4.0)
MCH: 31.9 pg (ref 26.0–34.0)
MCH: 32.5 pg (ref 26.0–34.0)
MCHC: 33.2 g/dL (ref 30.0–36.0)
MCHC: 33.9 g/dL (ref 30.0–36.0)
MCV: 96.1 fL (ref 78.0–100.0)
MCV: 95.8 fL (ref 78.0–100.0)
MONO ABS: 0.9 10*3/uL (ref 0.1–1.0)
MONOS PCT: 10 % (ref 3–12)
Monocytes Absolute: 1 10*3/uL (ref 0.1–1.0)
Monocytes Relative: 13 % — ABNORMAL HIGH (ref 3–12)
Neutro Abs: 6.6 10*3/uL (ref 1.7–7.7)
Neutro Abs: 7.7 10*3/uL (ref 1.7–7.7)
Neutrophils Relative %: 83 % — ABNORMAL HIGH (ref 43–77)
Neutrophils Relative %: 83 % — ABNORMAL HIGH (ref 43–77)
PLATELETS: 35 10*3/uL — AB (ref 150–400)
PLATELETS: 109 10*3/uL — AB (ref 150–400)
RBC: 3.89 MIL/uL (ref 3.87–5.11)
RBC: 3.82 MIL/uL — AB (ref 3.87–5.11)
RDW: 13.3 % (ref 11.5–15.5)
RDW: 13.2 % (ref 11.5–15.5)
WBC: 7.7 10*3/uL (ref 4.0–10.5)
WBC: 9.2 10*3/uL (ref 4.0–10.5)

## 2014-11-12 LAB — HEPATIC FUNCTION PANEL
ALK PHOS: 104 U/L (ref 39–117)
ALT: 569 U/L — AB (ref 0–35)
AST: 1069 U/L — AB (ref 0–37)
Albumin: 3.8 g/dL (ref 3.5–5.2)
BILIRUBIN DIRECT: 1.7 mg/dL — AB (ref 0.0–0.5)
BILIRUBIN TOTAL: 2.8 mg/dL — AB (ref 0.3–1.2)
Indirect Bilirubin: 1.1 mg/dL — ABNORMAL HIGH (ref 0.3–0.9)
Total Protein: 6.7 g/dL (ref 6.0–8.3)

## 2014-11-12 LAB — I-STAT CG4 LACTIC ACID, ED: LACTIC ACID, VENOUS: 2.8 mmol/L — AB (ref 0.5–2.0)

## 2014-11-12 LAB — TSH: TSH: 0.348 u[IU]/mL — ABNORMAL LOW (ref 0.350–4.500)

## 2014-11-12 MED ORDER — BOOST / RESOURCE BREEZE PO LIQD
1.0000 | Freq: Two times a day (BID) | ORAL | Status: DC
  Administered 2014-11-13 – 2014-11-16 (×6): 1 via ORAL

## 2014-11-12 MED ORDER — SODIUM CHLORIDE 0.9 % IV SOLN: Freq: Once | INTRAVENOUS | Status: DC

## 2014-11-12 MED ORDER — SODIUM CHLORIDE 0.9 % IV BOLUS (SEPSIS)
1000.0000 mL | Freq: Once | INTRAVENOUS | Status: AC
  Administered 2014-11-12: 1000 mL via INTRAVENOUS

## 2014-11-12 MED ORDER — ACETAMINOPHEN 325 MG PO TABS
650.0000 mg | ORAL_TABLET | Freq: Once | ORAL | Status: AC
  Administered 2014-11-12: 650 mg via ORAL
  Filled 2014-11-12: qty 2

## 2014-11-12 MED ORDER — KCL IN DEXTROSE-NACL 20-5-0.9 MEQ/L-%-% IV SOLN
INTRAVENOUS | Status: DC
  Administered 2014-11-12 – 2014-11-14 (×3): via INTRAVENOUS

## 2014-11-12 NOTE — Progress Notes (Signed)
Utilization review Completed Jericho Cieslik RN BSN   

## 2014-11-12 NOTE — ED Notes (Signed)
Dr. Marin Comment in to assess for admission.

## 2014-11-12 NOTE — Progress Notes (Signed)
INITIAL NUTRITION ASSESSMENT  DOCUMENTATION CODES Per approved criteria  -Not Applicable   INTERVENTION: -Resource Breeze po BID, each supplement provides 250 kcal and 9 grams of protein  NUTRITION DIAGNOSIS: Increased protein needs related to acute illness as evidenced by fever.   Goal: Pt to meet >/= 90% of their estimated nutrition needs    Monitor:  Oral intake, Diet tolerance, labs, fluid status, better weight/intake hx  Reason for Assessment: *MST  81 y.o. female  Admitting Dx: Altered mental status  ASSESSMENT: 81 y.o. female with hx of breast cancer, hyperlipidemia, hx of anemia, peripheral neuropathy, spinal stenosis, brought to the ER as she was having altered mental status.   Per notes: She has had n/v/d  No recent hx of weight loss.  Febrile, gastroenteritis, volume depleted  Nutrition Focused Physical Exam: Unable to complete  Height: Ht Readings from Last 1 Encounters:  04/04/09 5\' 4"  (1.626 m)    Weight: Wt Readings from Last 1 Encounters:  11/12/14 110 lb 14.4 oz (50.304 kg)    Ideal Body Weight: 120 lbs  % Ideal Body Weight: 92%  Wt Readings from Last 10 Encounters:  11/12/14 110 lb 14.4 oz (50.304 kg)  04/04/09 123 lb (55.792 kg)  01/10/09 118 lb (53.524 kg)  10/10/08 111 lb (50.349 kg)  09/06/08 108 lb (48.988 kg)  05/12/08 111 lb (50.349 kg)  02/11/08 107 lb (48.535 kg)  01/28/08 107 lb (48.535 kg)  02/10/07 109 lb (49.442 kg)  01/27/07 106 lb (48.081 kg)    Usual Body Weight: Unknown  BMI:  Body mass index is 19.03 kg/(m^2).  Estimated Nutritional Needs: Kcal: 1500-1650 (30-33 kcal/kg) Protein: 60-70 (1.2-1.4 kg) Fluid: 1.5-1.7 liters  Skin: WDL  Diet Order: Diet regular Room service appropriate?: Yes; Fluid consistency:: Thin  EDUCATION NEEDS: -No education needs identified at this time   Intake/Output Summary (Last 24 hours) at 11/12/14 0814 Last data filed at 11/12/14 0530  Gross per 24 hour  Intake    120 ml   Output     14 ml  Net    106 ml    Last BM: 4/28  Labs:   Recent Labs Lab 11/11/14 2314  NA 140  K 4.4  CL 104  CO2 29  BUN 15  CREATININE 0.60  CALCIUM 9.3  GLUCOSE 139*    CBG (last 3)  No results for input(s): GLUCAP in the last 72 hours.  Scheduled Meds:   Continuous Infusions: . dextrose 5 % and 0.9 % NaCl with KCl 20 mEq/L 75 mL/hr at 11/12/14 0709    Past Medical History  Diagnosis Date  . Hyperlipidemia   . Cancer   . Breast cancer     Past Surgical History  Procedure Laterality Date  . Mastectomy     Burtis Junes RD, LDN Nutrition Pager: [phone] 11/12/2014 8:14 AM

## 2014-11-12 NOTE — H&P (Signed)
[facility] History and Physical  Alexandra [name]:[id number] DOB: 1925-08-14    PCP:   Maricela Curet, MD   Chief Complaint: sent in from assisted living facility for altered mental status, nausea, vomiting and diarrhea.   HPI: Alexandra [name] is an 81 y.o. female with hx of breast cancer, hyperlipidemia, hx of anemia, peripheral neuropathy, spinal stenosis, brought to the ER as she was having altered mental status.  She was lethargic originally, but later became much more alert, though still confused and was not able to tell good history.  I called the assisted living and spoke with personels there.  She normally able to ambulate and converse meaningfully, but today was confused.  She had nausea, vomiting, no complaint of abdominal pain, and had diarrhea.  No one else at the facility had similar complaints.  She hadn't been started on new meds, and has not been on antibiotics recently.  In the ER, she was found to have a negative head CT, platelet count of 35K, and normal WBC.  Her lactic acid was slightly elevated and her UA and CXR were negative.  She did have a temp spike of 102.  She were pan cultured, and given IVF, and hospitalist was asked to admit her for altered mental status, GI symptoms, and thrombocytopenia.   Rewiew of Systems: Unable to obtain meaningful ROS.    Past Medical History  Diagnosis Date  . Hyperlipidemia   . Cancer   . Breast cancer     Past Surgical History  Procedure Laterality Date  . Mastectomy      Medications:  HOME MEDS: Prior to Admission medications   Medication Sig Start Date End Date Taking? Authorizing Provider  omeprazole (PRILOSEC) 20 MG capsule Take 20 mg by mouth daily. 10/20/14   Historical Provider, MD  pravastatin (PRAVACHOL) 40 MG tablet Take 40 mg by mouth daily. 10/20/14   Historical Provider, MD     Allergies:  Allergies  Allergen Reactions  . Aspirin     REACTION: "Messes up my heart and tells it to stop"  . Penicillins      REACTION: Pains and feels it messes her up    Social History:   reports that she has quit smoking. She does not have any smokeless tobacco history on file. She reports that she does not drink alcohol or use illicit drugs.  Family History: History reviewed. No pertinent family history.   Physical Exam: Filed Vitals:   11/11/14 2245 11/11/14 2343 11/12/14 0018 11/12/14 0111  BP: 138/66  134/63 135/59  Pulse: 85   84  Temp: 99.9 F (37.7 C) 102.3 F (39.1 C)  101.2 F (38.4 C)  TempSrc: Oral Rectal  Rectal  Resp: 18   16  Weight: 56.7 kg (125 lb)     SpO2: 100%   98%   Blood pressure 135/59, pulse 84, temperature 101.2 F (38.4 C), temperature source Rectal, resp. rate 16, weight 56.7 kg (125 lb), SpO2 98 %.  GEN:  Pleasant  patient lying in the stretcher in no acute distress; cooperative with exam. PSYCH:  alert and slightly confused; does not appear anxious or depressed; affect is appropriate. HEENT: Mucous membranes pink and anicteric; PERRLA; EOM intact; no cervical lymphadenopathy nor thyromegaly or carotid bruit; no JVD; There were no stridor. Neck is very supple. Breasts:: Not examined CHEST WALL: No tenderness CHEST: Normal respiration, clear to auscultation bilaterally.  HEART: Regular rate and rhythm.  There are no murmur, rub, or gallops.  BACK: No kyphosis or scoliosis; no CVA tenderness ABDOMEN: soft and non-tender; no masses, no organomegaly, normal abdominal bowel sounds; no pannus; no intertriginous candida. There is no rebound and no distention. Rectal Exam: Not done EXTREMITIES: No bone or joint deformity; age-appropriate arthropathy of the hands and knees; no edema; no ulcerations.  There is no calf tenderness. Genitalia: not examined PULSES: 2+ and symmetric SKIN: Normal hydration no rash or ulceration CNS: Cranial nerves 2-12 grossly intact no focal lateralizing neurologic deficit.  Speech is fluent; uvula elevated with phonation, facial symmetry and  tongue midline. DTR are normal bilaterally, cerebella exam is intact, barbinski is negative and strengths are equaled bilaterally.  No sensory loss.   Labs on Admission:  Basic Metabolic Panel:  Recent Labs Lab 11/11/14 2314  NA 140  K 4.4  CL 104  CO2 29  GLUCOSE 139*  BUN 15  CREATININE 0.60  CALCIUM 9.3   CBC:  Recent Labs Lab 11/11/14 2314  WBC 7.7  NEUTROABS 6.6  HGB 12.4  HCT 37.4  MCV 96.1  PLT 35*    Radiological Exams on Admission: Ct Head Wo Contrast  11/12/2014   CLINICAL DATA:  Acute onset of altered mental status. Initial encounter.  EXAM: CT HEAD WITHOUT CONTRAST  TECHNIQUE: Contiguous axial images were obtained from the base of the skull through the vertex without intravenous contrast.  COMPARISON:  CT of the head performed 07/22/2003  FINDINGS: There is no evidence of acute infarction, mass lesion, or intra- or extra-axial hemorrhage on CT.  Prominence of the ventricles sulci reflects moderate cortical volume loss. Mild periventricular white matter change may reflect small vessel ischemic microangiopathy. Evaluation is mildly suboptimal due to motion artifact.  The brainstem and fourth ventricle are within normal limits. The basal ganglia are unremarkable in appearance. The cerebral hemispheres demonstrate grossly normal gray-white differentiation. No mass effect or midline shift is seen.  There is no evidence of fracture; visualized osseous structures are unremarkable in appearance. The visualized portions of the orbits are within normal limits. The paranasal sinuses and mastoid air cells are well-aerated. No significant soft tissue abnormalities are seen.  IMPRESSION: 1. No acute intracranial pathology seen on CT. 2. Moderate cortical volume loss and scattered small vessel ischemic microangiopathy.   Electronically Signed   By: Garald Balding M.D.   On: 11/12/2014 01:39   Dg Chest Port 1 View  11/11/2014   CLINICAL DATA:  Altered mental status.  History of breast  cancer.  EXAM: PORTABLE CHEST - 1 VIEW  COMPARISON:  Chest radiograph January 28, 2007  FINDINGS: Cardiac silhouette is normal. Mildly calcified aortic knob. The lungs are clear without pleural effusions or focal consolidations. Trachea projects midline and there is no pneumothorax. Soft tissue planes and included osseous structures are non-suspicious.  IMPRESSION: No acute cardiopulmonary process.   Electronically Signed   By: Elon Alas   On: 11/11/2014 23:53    Assessment/Plan Present on Admission:  . Altered mental status . Gastroenteritis . Fever  PLAN:  Will admit her for further work up.  Will send her stool for C diff and stool study, and keep enteric precaution.  I don't know why her platelet count is low, and will repeat it.  If persistent, will consider Heme/Onc consultation.  She has an infection, and may well have a viral gastroenteritis.   She was confused, but is doing better, and will follow clinically.  She appears volume depleted, and will be given some IVF.  She is otherwise  stable, full code, and will be admitted to Dr Cindie Laroche per prior arrangement.  Thank you for allowing me to participate in the care of your nice patient.   Other plans as per orders.  Code Status: FULL Haskel Khan, MD. [facility] Pager [phone] 7pm to 7am.  11/12/2014, 2:11 AM

## 2014-11-13 LAB — COMPREHENSIVE METABOLIC PANEL
ALBUMIN: 3 g/dL — AB (ref 3.5–5.0)
ALT: 319 U/L — AB (ref 14–54)
ANION GAP: 4 — AB (ref 5–15)
AST: 306 U/L — AB (ref 15–41)
Alkaline Phosphatase: 93 U/L (ref 38–126)
BILIRUBIN TOTAL: 1.8 mg/dL — AB (ref 0.3–1.2)
BUN: 8 mg/dL (ref 6–20)
CHLORIDE: 109 mmol/L (ref 101–111)
CO2: 28 mmol/L (ref 22–32)
Calcium: 8.3 mg/dL — ABNORMAL LOW (ref 8.9–10.3)
Creatinine, Ser: 0.6 mg/dL (ref 0.44–1.00)
GFR calc Af Amer: >60 mL/min (ref >60)
GFR calc non Af Amer: >60 mL/min (ref >60)
Glucose, Bld: 92 mg/dL (ref 70–99)
POTASSIUM: 3.8 mmol/L (ref 3.5–5.1)
Sodium: 141 mmol/L (ref 135–145)
Total Protein: 5.5 g/dL — ABNORMAL LOW (ref 6.5–8.1)

## 2014-11-13 LAB — CBC
HEMATOCRIT: 31.9 % — AB (ref 36.0–46.0)
Hemoglobin: 10.9 g/dL — ABNORMAL LOW (ref 12.0–15.0)
MCH: 32.6 pg (ref 26.0–34.0)
MCHC: 34.2 g/dL (ref 30.0–36.0)
MCV: 95.5 fL (ref 78.0–100.0)
Platelets: 95 10*3/uL — ABNORMAL LOW (ref 150–400)
RBC: 3.34 MIL/uL — ABNORMAL LOW (ref 3.87–5.11)
RDW: 13.4 % (ref 11.5–15.5)
WBC: 4.6 10*3/uL (ref 4.0–10.5)

## 2014-11-13 LAB — PROTIME-INR
INR: 1.28 (ref 0.00–1.49)
Prothrombin Time: 16.2 seconds — ABNORMAL HIGH (ref 11.6–15.2)

## 2014-11-13 NOTE — Progress Notes (Signed)
Alexandra [name] ASN:[id number] DOB: 1925-12-27 DOA: 11/11/2014 PCP: Maricela Curet, MD   Subjective: This lady was admitted with altered mental status. An first name, the patient is unable to give me any clear history this morning. She appears to be alert but wonders in our conversation.           Physical Exam: Blood pressure 119/67, pulse 79, temperature 98.3 F (36.8 C), temperature source Oral, resp. rate 18, weight 50.304 kg (110 lb 14.4 oz), SpO2 96 %. Somewhat confused but alert. There are no focal neurological signs. Lung fields are clear. Heart sounds are present without murmurs or added sounds. Her abdomen is soft but slightly tender in a generalized fashion. Bowel sounds are clearly heard.   Investigations:  Recent Results (from the past 240 hour(s))  Blood culture (routine x 2)     Status: None (Preliminary result)   Collection Time: 11/12/14 12:51 AM  Result Value Ref Range Status   Specimen Description LEFT ANTECUBITAL  Final   Special Requests BOTTLES DRAWN AEROBIC ONLY 6CC AEB  Final   Culture NO GROWTH <24 HRS  Final   Report Status PENDING  Incomplete  Blood culture (routine x 2)     Status: None (Preliminary result)   Collection Time: 11/12/14  2:01 AM  Result Value Ref Range Status   Specimen Description LEFT ANTECUBITAL  Final   Special Requests BOTTLES DRAWN AEROBIC ONLY 4CC  Final   Culture NO GROWTH <24 HRS  Final   Report Status PENDING  Incomplete     Basic Metabolic Panel:  Recent Labs  11/11/14 2314 11/13/14 0658  NA 140 141  K 4.4 3.8  CL 104 109  CO2 29 28  GLUCOSE 139* 92  BUN 15 8  CREATININE 0.60 0.60  CALCIUM 9.3 8.3*   Liver Function Tests:  Recent Labs  11/12/14 0201 11/13/14 0658  AST 1069* 306*  ALT 569* 319*  ALKPHOS 104 93  BILITOT 2.8* 1.8*  PROT 6.7 5.5*  ALBUMIN 3.8 3.0*     CBC:  Recent Labs  11/11/14 2314 11/12/14 0201 11/13/14 0658  WBC 7.7 9.2 4.6  NEUTROABS 6.6 7.7  --   HGB 12.4 12.4 10.9*   HCT 37.4 36.6 31.9*  MCV 96.1 95.8 95.5  PLT 35* 109* 95*    Ct Head Wo Contrast  11/12/2014   CLINICAL DATA:  Acute onset of altered mental status. Initial encounter.  EXAM: CT HEAD WITHOUT CONTRAST  TECHNIQUE: Contiguous axial images were obtained from the base of the skull through the vertex without intravenous contrast.  COMPARISON:  CT of the head performed 07/22/2003  FINDINGS: There is no evidence of acute infarction, mass lesion, or intra- or extra-axial hemorrhage on CT.  Prominence of the ventricles sulci reflects moderate cortical volume loss. Mild periventricular white matter change may reflect small vessel ischemic microangiopathy. Evaluation is mildly suboptimal due to motion artifact.  The brainstem and fourth ventricle are within normal limits. The basal ganglia are unremarkable in appearance. The cerebral hemispheres demonstrate grossly normal gray-white differentiation. No mass effect or midline shift is seen.  There is no evidence of fracture; visualized osseous structures are unremarkable in appearance. The visualized portions of the orbits are within normal limits. The paranasal sinuses and mastoid air cells are well-aerated. No significant soft tissue abnormalities are seen.  IMPRESSION: 1. No acute intracranial pathology seen on CT. 2. Moderate cortical volume loss and scattered small vessel ischemic microangiopathy.   Electronically Signed   By: Jacqulynn Cadet  Chang M.D.   On: 11/12/2014 01:39   Dg Chest Port 1 View  11/11/2014   CLINICAL DATA:  Altered mental status.  History of breast cancer.  EXAM: PORTABLE CHEST - 1 VIEW  COMPARISON:  Chest radiograph January 28, 2007  FINDINGS: Cardiac silhouette is normal. Mildly calcified aortic knob. The lungs are clear without pleural effusions or focal consolidations. Trachea projects midline and there is no pneumothorax. Soft tissue planes and included osseous structures are non-suspicious.  IMPRESSION: No acute cardiopulmonary process.    Electronically Signed   By: Elon Alas   On: 11/11/2014 23:53      Medications: I have reviewed the patient's current medications.  Impression: 1. Altered mental status. Etiology is unclear to me. She does not appear to have any clear source of infection. We will obtain MRI brain scan for further evaluation. 2. Elevated liver enzymes. Her AST was significantly raised at 1069 yesterday but appears to be improved today at 306. Her ALT was at 569 but today it is at 319. Her bilirubin was increased at 2.8 but is down to 1.8. The alkaline phosphatase has remained normal throughout so this appears to be a hepatitic picture. 3. Fever. She does not appear to have a fever now. 4. Gastroenteritis. I think this is probably the source of her problems and it is not clear whether she still having diarrhea or not. Her abdomen is slightly tender but bowel sounds are heard and I think this is reflective of her gastroenteritis that  is probably improving.      Consultants:  None.   Procedures:  None.   Antibiotics:  None.                   Code Status: Full code.  Family Communication: No family members at the bedside.   Disposition Plan: Depending on progress.  Time spent: 15 minutes.   LOS: 1 day   [place] C   11/13/2014, 7:53 AM

## 2014-11-14 ENCOUNTER — Inpatient Hospital Stay (HOSPITAL_COMMUNITY): Payer: Medicare Other

## 2014-11-14 ENCOUNTER — Encounter (HOSPITAL_COMMUNITY): Payer: Self-pay | Admitting: Gastroenterology

## 2014-11-14 DIAGNOSIS — R7989 Other specified abnormal findings of blood chemistry: Secondary | ICD-10-CM | POA: Insufficient documentation

## 2014-11-14 DIAGNOSIS — R945 Abnormal results of liver function studies: Secondary | ICD-10-CM

## 2014-11-14 DIAGNOSIS — R4182 Altered mental status, unspecified: Principal | ICD-10-CM

## 2014-11-14 LAB — COMPREHENSIVE METABOLIC PANEL
ALBUMIN: 3.2 g/dL — AB (ref 3.5–5.0)
ALT: 256 U/L — AB (ref 14–54)
ANION GAP: 4 — AB (ref 5–15)
AST: 165 U/L — ABNORMAL HIGH (ref 15–41)
Alkaline Phosphatase: 119 U/L (ref 38–126)
BUN: 7 mg/dL (ref 6–20)
CO2: 31 mmol/L (ref 22–32)
Calcium: 8.9 mg/dL (ref 8.9–10.3)
Chloride: 110 mmol/L (ref 101–111)
Creatinine, Ser: 0.56 mg/dL (ref 0.44–1.00)
GFR calc Af Amer: >60 mL/min (ref >60)
GFR calc non Af Amer: >60 mL/min (ref >60)
Glucose, Bld: 89 mg/dL (ref 70–99)
POTASSIUM: 3.7 mmol/L (ref 3.5–5.1)
SODIUM: 145 mmol/L (ref 135–145)
TOTAL PROTEIN: 6 g/dL — AB (ref 6.5–8.1)
Total Bilirubin: 1.2 mg/dL (ref 0.3–1.2)

## 2014-11-14 LAB — CBC
HCT: 36.6 % (ref 36.0–46.0)
HEMATOCRIT: 35.2 % — AB (ref 36.0–46.0)
HEMOGLOBIN: 12 g/dL (ref 12.0–15.0)
Hemoglobin: 12.4 g/dL (ref 12.0–15.0)
MCH: 32.3 pg (ref 26.0–34.0)
MCH: 32.1 pg (ref 26.0–34.0)
MCHC: 34.1 g/dL (ref 30.0–36.0)
MCHC: 33.9 g/dL (ref 30.0–36.0)
MCV: 94.6 fL (ref 78.0–100.0)
MCV: 94.8 fL (ref 78.0–100.0)
Platelets: 114 10*3/uL — ABNORMAL LOW (ref 150–400)
Platelets: 117 10*3/uL — ABNORMAL LOW (ref 150–400)
RBC: 3.72 MIL/uL — AB (ref 3.87–5.11)
RBC: 3.86 MIL/uL — ABNORMAL LOW (ref 3.87–5.11)
RDW: 13.3 % (ref 11.5–15.5)
RDW: 13.3 % (ref 11.5–15.5)
WBC: 3.9 10*3/uL — AB (ref 4.0–10.5)
WBC: 3.6 10*3/uL — ABNORMAL LOW (ref 4.0–10.5)

## 2014-11-14 LAB — VITAMIN B12: VITAMIN B 12: 1032 pg/mL — AB (ref 180–914)

## 2014-11-14 LAB — SEDIMENTATION RATE: Sed Rate: 17 mm/hr (ref 0–22)

## 2014-11-14 LAB — FOLATE: Folate: 58.6 ng/mL (ref >5.9)

## 2014-11-14 LAB — TSH: TSH: 0.717 u[IU]/mL (ref 0.350–4.500)

## 2014-11-14 LAB — AMMONIA: Ammonia: 22 umol/L (ref 9–35)

## 2014-11-14 LAB — PROTIME-INR
INR: 1.06 (ref 0.00–1.49)
Prothrombin Time: 13.9 seconds (ref 11.6–15.2)

## 2014-11-14 NOTE — Consult Note (Signed)
Referring Provider: Dr. Cindie Laroche Primary Care Physician:  Maricela Curet, MD Primary Gastroenterologist:  Dr. Gala Romney   Date of Admission: 11/11/14 Date of Consultation: 11/14/14  Reason for Consultation:  Elevated LFTs  HPI:  Alexandra [name] is an 81 y.o. year old female who was admitted with altered mental status. She is a poor historian, and history was unable to be accurately obtained from patient. Medical records reviewed for information. Appears she also had nausea, vomiting, and diarrhea. No further vomiting or diarrhea since admission. She was tolerating her meal when seen in consultation. Tmax of 102, with pan cultures completed. LFTs elevated with AST 1069, ALT 569, Tbili 2.8, direct bilrubin 1.7, indirect 1.1. LFTs have continued to improve since admission with rapid decline of transaminases. US abdomen revealed multiple gallstones, minimal gallbladder wall thickening present. Unable to exclude cholecystitis. Per Audrea Muscat, patient is now at baseline.   Past Medical History  Diagnosis Date  . Hyperlipidemia   . Cancer   . Breast cancer     Past Surgical History  Procedure Laterality Date  . Mastectomy      Prior to Admission medications   Medication Sig Start Date End Date Taking? Authorizing Provider  Multiple Vitamin (MULTIVITAMIN WITH MINERALS) TABS tablet Take 1 tablet by mouth daily.   Yes Historical Provider, MD  omeprazole (PRILOSEC) 20 MG capsule Take 20 mg by mouth daily. 10/20/14  Yes Historical Provider, MD  pravastatin (PRAVACHOL) 40 MG tablet Take 40 mg by mouth daily. 10/20/14  Yes Historical Provider, MD    Current Facility-Administered Medications  Medication Dose Route Frequency Provider Last Rate Last Dose  . dextrose 5 % and 0.9 % NaCl with KCl 20 mEq/L infusion   Intravenous Continuous Orvan Falconer, MD 75 mL/hr at 11/13/14 1021    . feeding supplement (RESOURCE BREEZE) (RESOURCE BREEZE) liquid 1 Container  1 Container Oral BID BM Oswaldo Milian, RD   1 Container  at 11/14/14 1400    Allergies as of 11/11/2014 - Review Complete 11/11/2014  Allergen Reaction Noted  . Aspirin  09/06/2008  . Penicillins  01/15/2007    Family History  Problem Relation Age of Onset  . Colon cancer      unknown    History   Social History  . Marital Status: Widowed    Spouse Name: N/A  . Number of Children: N/A  . Years of Education: N/A   Occupational History  . Not on file.   Social History Main Topics  . Smoking status: Former Research scientist (life sciences)  . Smokeless tobacco: Not on file  . Alcohol Use: No     Comment: in the past per patient  . Drug Use: No  . Sexual Activity: Not on file   Other Topics Concern  . Not on file   Social History Narrative    Review of Systems: Unable to obtain due to cognitive status.   Physical Exam: Vital signs in last 24 hours: Temp:  [97.7 F (36.5 C)-98.6 F (37 C)] 97.7 F (36.5 C) (05/02 0544) Pulse Rate:  [69-79] 79 (05/02 0544) Resp:  [18] 18 (05/02 0544) BP: (133-143)/(55-98) 133/98 mmHg (05/02 0544) SpO2:  [100 %] 100 % (05/02 0544) Weight:  [110 lb (49.896 kg)] 110 lb (49.896 kg) (05/02 0849) Last BM Date: 11/10/14 General:   Alert, oriented to person only. Does not know year, place, or reason for admission  Head:  Normocephalic and atraumatic. Eyes:  Sclera clear, no icterus.   Conjunctiva pink. Ears:  Hard of  hearing Nose:  No deformity, discharge,  or lesions. Mouth:  Poor dentition  Lungs:  Clear throughout to auscultation.    Heart:  S1 S2 present without murmurs Abdomen:  Soft, nontender and nondistended. No masses, hepatosplenomegaly or hernias noted. Normal bowel sounds, without guarding, and without rebound.   Rectal:  Deferred  Msk:  Slight kyphosis  Pulses:  Normal pulses noted. Extremities:  Without  edema. Neurologic:  Alert and  oriented to person only  Skin:  Intact without significant lesions or rashes. Psych:  Alert and cooperative. Normal mood and affect.  Intake/Output from previous  day: 05/01 0701 - 05/02 0700 In: 560 [P.O.:560] Out: -  Intake/Output this shift:    Lab Results:  Recent Labs  11/12/14 0201 11/13/14 0658 11/14/14 0541  WBC 9.2 4.6 3.9*  HGB 12.4 10.9* 12.0  HCT 36.6 31.9* 35.2*  PLT 109* 95* 114*   BMET  Recent Labs  11/11/14 2314 11/13/14 0658 11/14/14 0541  NA 140 141 145  K 4.4 3.8 3.7  CL 104 109 110  CO2 29 28 31   GLUCOSE 139* 92 89  BUN 15 8 7   CREATININE 0.60 0.60 0.56  CALCIUM 9.3 8.3* 8.9   LFT  Recent Labs  11/12/14 0201 11/13/14 0658 11/14/14 0541  PROT 6.7 5.5* 6.0*  ALBUMIN 3.8 3.0* 3.2*  AST 1069* 306* 165*  ALT 569* 319* 256*  ALKPHOS 104 93 119  BILITOT 2.8* 1.8* 1.2  BILIDIR 1.7*  --   --   IBILI 1.1*  --   --    PT/INR  Recent Labs  11/13/14 0658  LABPROT 16.2*  INR 1.28    Studies/Results: Mr Brain Wo Contrast  11/14/2014   CLINICAL DATA:  Altered mental status.  EXAM: MRI HEAD WITHOUT CONTRAST  TECHNIQUE: Multiplanar, multiecho pulse sequences of the brain and surrounding structures were obtained without intravenous contrast.  COMPARISON:  CT of the head without contrast 11/12/2014.  FINDINGS: Diffusion-weighted images scratch the the study is moderately degraded by patient motion.  The diffusion-weighted images demonstrate no evidence for acute or subacute infarction. Moderate generalized atrophy and mild periventricular white matter disease is evident bilaterally.  No acute hemorrhage or mass lesion is present. The ventricles are proportionate to the degree of atrophy.  Flow is present in the major intracranial arteries. The globes and orbits are intact. The paranasal sinuses and mastoid air cells are clear.  IMPRESSION: 1. No acute intracranial abnormality. 2. Moderate atrophy and mild periventricular and subcortical white matter disease. This likely reflects the sequela of chronic microvascular ischemia. 3. The study is moderately degraded by patient motion. This decreases sensitivity for small  lesions.   Electronically Signed   By: San Morelle M.D.   On: 11/14/2014 09:36   US Abdomen Complete  11/14/2014   CLINICAL DATA:  Hepatitis.  Breast cancer.  EXAM: ULTRASOUND ABDOMEN COMPLETE  COMPARISON:  MRI 02/16/2007.  FINDINGS: Gallbladder: Multiple gallstones. Gallbladder wall thickness is slightly prominent 3.5 mm. Negative Murphy sign.  Common bile duct: Diameter:  Liver: 2.9 mm.  IVC: No abnormality visualized.  Pancreas: Visualized portion unremarkable.  Spleen: Size and appearance within normal limits.  Right Kidney: Length: 10.0 cm. Echogenicity within normal limits. No mass or hydronephrosis visualized. 3.9 cm cyst with thin septation consistent with benign cyst. No significant interim change from prior MRI of 02/16/2007.  Left Kidney: Length: 8.9 cm cm. Echogenicity within normal limits. No mass or hydronephrosis visualized.  Abdominal aorta: No aneurysm visualized.  Other findings:  Trace ascites.  IMPRESSION: 1. Multiple gallstones. Very minimal gallbladder wall thickening is present. Cholecystitis cannot be completely excluded. 2. Trace ascites. 3. Stable benign appearing cyst right kidney .   Electronically Signed   By: Marcello Moores  Register   On: 11/14/2014 09:48    Impression: 81 year old female admitted with altered mental status, nausea, vomiting, and diarrhea, now clinically improved since admission with resolution of symptoms and actually tolerating a regular diet. Associated significant transaminitis with US abdomen showing multiple gallstones, very minimal gallbladder wall thickening, unable to exclude cholecystitis. Clinically, her exam is benign, and she does not appear acutely ill at time of consultation. Due to febrile state on admission, blood cultures completed with no growth thus far. Differentials include microlithiasis, unable to exclude cholecystitis, viral illness precipitating ischemic hepatitis.  May benefit from HIDA scan to further sort out. Doubt viral hepatitis.  LFTs continue to significantly improve with supportive measures. Will check viral markers to be thorough.   Plan: Viral markers, INR Continue supportive measures Follow LFTs to baseline Consider HIDA Repeat HFP in am Will continue to follow with you  Orvil Feil, ANP-BC [facility]   Attending note:  Patient seen and examined this evening.   Agree with assessment and recommendations as outlined above. We could pursue a HIDA or, alternatively, go directly to non-urgent surgical consultation for consideration of cholecystectomy at some point in the near future. Agree with closely following LFTs.   LOS: 2 days    11/14/2014, 2:00 PM

## 2014-11-14 NOTE — Clinical Social Work Note (Signed)
Clinical Social Work Assessment  Patient Details  Name: Alexandra [name] MRN: [id number] Date of Birth: 08-16-1925  Date of referral:  11/14/14               Reason for consult:  Discharge Planning                Permission sought to share information with:    Permission granted to share information::     Name::        Agency::     Relationship::     Contact Information:     Housing/Transportation Living arrangements for the past 2 months:  [place] of Information:  Adult Children Patient Interpreter Needed:  None Criminal Activity/Legal Involvement Pertinent to Current Situation/Hospitalization:  No - Comment as needed Significant Relationships:  Adult Children Lives with:  Facility Resident Do you feel safe going back to the place where you live?  Yes Need for family participation in patient care:  Yes (Comment) (Son calls regularly to check on pt.)  Care giving concerns:  Pt is long term resident at [facility].    Social Worker assessment / plan:  CSW spoke with [place] on phone as pt is oriented to self only at this time. Iona Beard reports pt has been resident at [facility] for several years now. He lives in [place] and calls to check on pt often, but is limited in his ability to visit due to lack of transportation. Iona Beard appears to be very knowledgeable about pt's baseline. He requests return to [facility] when medically stable. Per Edwyna Ready, administrator at facility, pt is fairly independent at baseline. She requires assist with bathing. Zack reports pt had vomiting and diarrhea on Friday and became very weak and confused. They called EMS for pt to be evaluated in ED. At baseline, pt is oriented x 4 per facility and son. Okay for return.   Employment status:  Retired Nurse, adult PT Recommendations:  Not assessed at this time Information / Referral to community resources:  Other (Comment Required) (Return to [facility])  Patient/Family's Response to care:  Iona Beard requests return to [facility] at d/c. Pt considers this "home."  Patient/Family's Understanding of and Emotional Response to Diagnosis, Current Treatment, and Prognosis:  Iona Beard appears to have understanding of pt's diagnosis and treatment and asked appropriate questions. He reports he is very concerned about pt due to her age. He also expressed concern that pt is out of her comfort zone in hospital and he does not have transportation.   Emotional Assessment Appearance:  Other (Comment Required (Not assessed as pt oriented to self only) Attitude/Demeanor/Rapport:  Unable to Assess Affect (typically observed):  Unable to Assess Orientation:  Oriented to Self Alcohol / Substance use:  Not Applicable Psych involvement (Current and /or in the community):  No (Comment)  Discharge Needs  Concerns to be addressed:  Discharge Planning Concerns Readmission within the last 30 days:  No Current discharge risk:  None Barriers to Discharge:  Continued Medical Work up   [employer], [place] 11/14/2014, 1:45 PM [phone]

## 2014-11-14 NOTE — Progress Notes (Signed)
I do not recall the patient's level of dementia previously I will check with office charts and family is disoriented in place and time unable to give any meaningful history elevated LFTs Alexandra [name] LHT:[id number] DOB: 1925-07-27 DOA: 11/11/2014 PCP: Maricela Curet, MD             Physical Exam: Blood pressure 133/98, pulse 79, temperature 97.7 F (36.5 C), temperature source Oral, resp. rate 18, weight 110 lb 14.4 oz (50.304 kg), SpO2 100 %. no focal cranial nerve deficits patient was all 470s lungs clear to A&P no rales wheeze rhonchi heart regular rhythm no S3 or S4 no heaves thrills rubs abdomen soft no right upper quadrant tenderness no guarding or rebound   Investigations:  Recent Results (from the past 240 hour(s))  Blood culture (routine x 2)     Status: None (Preliminary result)   Collection Time: 11/12/14 12:51 AM  Result Value Ref Range Status   Specimen Description LEFT ANTECUBITAL  Final   Special Requests BOTTLES DRAWN AEROBIC ONLY 6CC AEB  Final   Culture NO GROWTH <24 HRS  Final   Report Status PENDING  Incomplete  Blood culture (routine x 2)     Status: None (Preliminary result)   Collection Time: 11/12/14  2:01 AM  Result Value Ref Range Status   Specimen Description LEFT ANTECUBITAL  Final   Special Requests BOTTLES DRAWN AEROBIC ONLY 4CC  Final   Culture NO GROWTH <24 HRS  Final   Report Status PENDING  Incomplete     Basic Metabolic Panel:  Recent Labs  11/11/14 2314 11/13/14 0658  NA 140 141  K 4.4 3.8  CL 104 109  CO2 29 28  GLUCOSE 139* 92  BUN 15 8  CREATININE 0.60 0.60  CALCIUM 9.3 8.3*   Liver Function Tests:  Recent Labs  11/12/14 0201 11/13/14 0658  AST 1069* 306*  ALT 569* 319*  ALKPHOS 104 93  BILITOT 2.8* 1.8*  PROT 6.7 5.5*  ALBUMIN 3.8 3.0*     CBC:  Recent Labs  11/11/14 2314 11/12/14 0201 11/13/14 0658  WBC 7.7 9.2 4.6  NEUTROABS 6.6 7.7  --   HGB 12.4 12.4 10.9*  HCT 37.4 36.6 31.9*  MCV 96.1 95.8  95.5  PLT 35* 109* 95*    No results found.    Medications:   Impression: Dementia  Principal Problem:   Altered mental status Active Problems:   Gastroenteritis   Fever     Plan: Abdominal ultrasound today to check assess the biliary tract dementia panel ordered be met ordered full check serum ammonia level with LFTs  Consultants: Gastroenterology requested   Procedures   Antibiotics:                   Code Status:   Family Communication:    Disposition Plan see plan above  Time spent: 30 minutes   LOS: 2 days   Ferrah Panagopoulos M   11/14/2014, 6:20 AM

## 2014-11-14 NOTE — Progress Notes (Signed)
Patient very impulsive. Patient gets up without assistance to bathroom, has unsteady gait.  Patient confused, patient was confused upon admission.  Patient is alert to self and place at times.  Safety sitter with patient to help decrease fall risk and to prevent pulling of tubes.

## 2014-11-15 LAB — HEPATIC FUNCTION PANEL
ALT: 179 U/L — ABNORMAL HIGH (ref 14–54)
AST: 74 U/L — ABNORMAL HIGH (ref 15–41)
Albumin: 3.7 g/dL (ref 3.5–5.0)
Alkaline Phosphatase: 125 U/L (ref 38–126)
Bilirubin, Direct: 0.2 mg/dL (ref 0.1–0.5)
Indirect Bilirubin: 0.6 mg/dL (ref 0.3–0.9)
TOTAL PROTEIN: 6.8 g/dL (ref 6.5–8.1)
Total Bilirubin: 0.8 mg/dL (ref 0.3–1.2)

## 2014-11-15 LAB — HEPATITIS PANEL, ACUTE
HCV Ab: NEGATIVE
HEP A IGM: NONREACTIVE
Hep B C IgM: NONREACTIVE
Hepatitis B Surface Ag: NEGATIVE

## 2014-11-15 LAB — BASIC METABOLIC PANEL
Anion gap: 9 (ref 5–15)
BUN: 9 mg/dL (ref 6–20)
CHLORIDE: 105 mmol/L (ref 101–111)
CO2: 29 mmol/L (ref 22–32)
Calcium: 9.3 mg/dL (ref 8.9–10.3)
Creatinine, Ser: 0.58 mg/dL (ref 0.44–1.00)
GFR calc Af Amer: >60 mL/min (ref >60)
GFR calc non Af Amer: >60 mL/min (ref >60)
Glucose, Bld: 84 mg/dL (ref 70–99)
Potassium: 4 mmol/L (ref 3.5–5.1)
Sodium: 143 mmol/L (ref 135–145)

## 2014-11-15 LAB — HIV ANTIBODY (ROUTINE TESTING W REFLEX): HIV Screen 4th Generation wRfx: NONREACTIVE

## 2014-11-15 LAB — RPR: RPR: NONREACTIVE

## 2014-11-15 MED ORDER — ALPRAZOLAM 0.5 MG PO TABS
0.5000 mg | ORAL_TABLET | Freq: Four times a day (QID) | ORAL | Status: DC | PRN
  Administered 2014-11-15: 0.5 mg via ORAL
  Filled 2014-11-15: qty 1

## 2014-11-15 MED ORDER — DONEPEZIL HCL 5 MG PO TABS
10.0000 mg | ORAL_TABLET | Freq: Every day | ORAL | Status: DC
  Administered 2014-11-15: 10 mg via ORAL
  Filled 2014-11-15: qty 2

## 2014-11-15 NOTE — Progress Notes (Signed)
Alexandra [name] ERD:[id number] DOB: Jan 08, 1926 DOA: 11/11/2014 PCP: Maricela Curet, MD Dementia workup essentially unrevealing. 30th A, B, and C serologies negative thus far patient to have surgical evaluation regarding cholelithiasis with gallbladder wall thickening possibly            Physical Exam: Blood pressure 157/89, pulse 78, temperature 98.7 F (37.1 C), temperature source Oral, resp. rate 18, weight 110 lb 14.4 oz (50.304 kg), SpO2 96 %. no significant right upper quadrant tenderness bowel sounds normoactive no guarding or rebound masses no megaly lungs clear to A&P no rales wheeze rhonchi heart regular rhythm no S3 or S4 no heaves thrills rubs   Investigations:  Recent Results (from the past 240 hour(s))  Blood culture (routine x 2)     Status: None (Preliminary result)   Collection Time: 11/12/14 12:51 AM  Result Value Ref Range Status   Specimen Description LEFT ANTECUBITAL  Final   Special Requests BOTTLES DRAWN AEROBIC ONLY 6CC AEB  Final   Culture NO GROWTH 3 DAYS  Final   Report Status PENDING  Incomplete  Blood culture (routine x 2)     Status: None (Preliminary result)   Collection Time: 11/12/14  2:01 AM  Result Value Ref Range Status   Specimen Description LEFT ANTECUBITAL  Final   Special Requests BOTTLES DRAWN AEROBIC ONLY 4CC  Final   Culture NO GROWTH 3 DAYS  Final   Report Status PENDING  Incomplete     Basic Metabolic Panel:  Recent Labs  11/13/14 0658 11/14/14 0541  NA 141 145  K 3.8 3.7  CL 109 110  CO2 28 31  GLUCOSE 92 89  BUN 8 7  CREATININE 0.60 0.56  CALCIUM 8.3* 8.9   Liver Function Tests:  Recent Labs  11/13/14 0658 11/14/14 0541  AST 306* 165*  ALT 319* 256*  ALKPHOS 93 119  BILITOT 1.8* 1.2  PROT 5.5* 6.0*  ALBUMIN 3.0* 3.2*     CBC:  Recent Labs  11/14/14 0541 11/14/14 1512  WBC 3.9* 3.6*  HGB 12.0 12.4  HCT 35.2* 36.6  MCV 94.6 94.8  PLT 114* 117*    Mr Brain Wo Contrast  11/14/2014   CLINICAL DATA:   Altered mental status.  EXAM: MRI HEAD WITHOUT CONTRAST  TECHNIQUE: Multiplanar, multiecho pulse sequences of the brain and surrounding structures were obtained without intravenous contrast.  COMPARISON:  CT of the head without contrast 11/12/2014.  FINDINGS: Diffusion-weighted images scratch the the study is moderately degraded by patient motion.  The diffusion-weighted images demonstrate no evidence for acute or subacute infarction. Moderate generalized atrophy and mild periventricular white matter disease is evident bilaterally.  No acute hemorrhage or mass lesion is present. The ventricles are proportionate to the degree of atrophy.  Flow is present in the major intracranial arteries. The globes and orbits are intact. The paranasal sinuses and mastoid air cells are clear.  IMPRESSION: 1. No acute intracranial abnormality. 2. Moderate atrophy and mild periventricular and subcortical white matter disease. This likely reflects the sequela of chronic microvascular ischemia. 3. The study is moderately degraded by patient motion. This decreases sensitivity for small lesions.   Electronically Signed   By: San Morelle M.D.   On: 11/14/2014 09:36   US Abdomen Complete  11/14/2014   CLINICAL DATA:  Hepatitis.  Breast cancer.  EXAM: ULTRASOUND ABDOMEN COMPLETE  COMPARISON:  MRI 02/16/2007.  FINDINGS: Gallbladder: Multiple gallstones. Gallbladder wall thickness is slightly prominent 3.5 mm. Negative Murphy sign.  Common bile duct:  Diameter:  Liver: 2.9 mm.  IVC: No abnormality visualized.  Pancreas: Visualized portion unremarkable.  Spleen: Size and appearance within normal limits.  Right Kidney: Length: 10.0 cm. Echogenicity within normal limits. No mass or hydronephrosis visualized. 3.9 cm cyst with thin septation consistent with benign cyst. No significant interim change from prior MRI of 02/16/2007.  Left Kidney: Length: 8.9 cm cm. Echogenicity within normal limits. No mass or hydronephrosis visualized.   Abdominal aorta: No aneurysm visualized.  Other findings: Trace ascites.  IMPRESSION: 1. Multiple gallstones. Very minimal gallbladder wall thickening is present. Cholecystitis cannot be completely excluded. 2. Trace ascites. 3. Stable benign appearing cyst right kidney .   Electronically Signed   By: Marcello Moores  Register   On: 11/14/2014 09:48      Medications:   Impression: Dementia Cholelithiasis possible cholecystitis  Principal Problem:   Altered mental status Active Problems:   Gastroenteritis   Fever   Elevated LFTs     Plan: Dementia workup essentially negative hepatitis A, B, and C serologies essentially negative awaiting surgical consult for cholelithiasis versus cholecystitis repeat LFTs in a.m. as well as CBC will add Alzheimer's medications denies pro 10 mg grams at bedtime   Consultants: Gastroenterology and surgery   Procedures   Antibiotics:                   Code Status:  Family Communication:    Disposition Plan see plan above  Time spent: 30 minutes   LOS: 3 days   [name]   11/15/2014, 1:22 PM

## 2014-11-15 NOTE — Care Management Note (Signed)
Case Management Note  Patient Details  Name: DAVIAN WOLLENBERG MRN: [id number] Date of Birth: September 22, 1925  Subjective/Objective:                    Action/Plan: Pt is from ALF, admitted with abdominal pain. Pt plans to return to ALF at discharge. CSW is aware and will arrange for return to facility. Pt independent at baseline. Pt has no CM needs. Will cont to follow.   Expected Discharge Date:  11/17/14               Expected Discharge Plan:  Assisted Living / Rest Home  In-House Referral:  Clinical Social Work  Discharge planning Services  CM Consult  Post Acute Care Choice:    Choice offered to:  NA  DME Arranged:    DME Agency:     HH Arranged:    HH Agency:     Status of Service:  In process, will continue to follow  Medicare Important Message Given:    Date Medicare IM Given:    Medicare IM give by:    Date Additional Medicare IM Given:    Additional Medicare Important Message give by:     If discussed at [place] of Stay Meetings, dates discussed:    Additional Comments:  Sherald Barge, RN 11/15/2014, 12:17 PM

## 2014-11-15 NOTE — Progress Notes (Signed)
Notified MD pt has been walking into other pt's rooms, refusing to go back to her room, and was combative with staff. Dr. Legrand Rams ordered 0.5 Xanax to be given q6hr prn.

## 2014-11-15 NOTE — Progress Notes (Signed)
MD states to notify first shift MD of the pt being unable to have labs drawn.

## 2014-11-15 NOTE — Progress Notes (Signed)
Subjective: Denies abdominal pain, N/V. Pleasantly confused. Tolerating diet.   Objective: Vital signs in last 24 hours: Temp:  [98.3 F (36.8 C)-98.7 F (37.1 C)] 98.7 F (37.1 C) (05/03 0523) Pulse Rate:  [62-78] 78 (05/03 0523) Resp:  [18-20] 18 (05/03 0523) BP: (93-157)/(55-89) 157/89 mmHg (05/03 0523) SpO2:  [96 %-100 %] 96 % (05/03 0523) Weight:  [110 lb (49.896 kg)] 110 lb (49.896 kg) (05/02 0849) Last BM Date: 11/10/14 General:   Alert and oriented to person. Sitting up in bed. Per sitter has been pacing around the room.  Head:  Normocephalic and atraumatic. Abdomen:  Bowel sounds present, soft, non-tender, non-distended. Limited exam with patient sitting up on side of bed.  Extremities:  Without  edema. Neurologic:  Alert and  oriented to person   Intake/Output from previous day: 05/02 0701 - 05/03 0700 In: 1080 [P.O.:480; I.V.:600] Out: 1050 [Urine:1050] Intake/Output this shift:    Lab Results:  Recent Labs  11/13/14 0658 11/14/14 0541 11/14/14 1512  WBC 4.6 3.9* 3.6*  HGB 10.9* 12.0 12.4  HCT 31.9* 35.2* 36.6  PLT 95* 114* 117*   BMET  Recent Labs  11/13/14 0658 11/14/14 0541  NA 141 145  K 3.8 3.7  CL 109 110  CO2 28 31  GLUCOSE 92 89  BUN 8 7  CREATININE 0.60 0.56  CALCIUM 8.3* 8.9   LFT  Recent Labs  11/13/14 0658 11/14/14 0541  PROT 5.5* 6.0*  ALBUMIN 3.0* 3.2*  AST 306* 165*  ALT 319* 256*  ALKPHOS 93 119  BILITOT 1.8* 1.2   PT/INR  Recent Labs  11/13/14 0658 11/14/14 1512  LABPROT 16.2* 13.9  INR 1.28 1.06   Hepatitis Panel  Recent Labs  11/14/14 1512  HEPBSAG NEGATIVE  HCVAB NEGATIVE  HEPAIGM NON REACTIVE  HEPBIGM NON REACTIVE    Studies/Results: Mr Brain Wo Contrast  11/14/2014   CLINICAL DATA:  Altered mental status.  EXAM: MRI HEAD WITHOUT CONTRAST  TECHNIQUE: Multiplanar, multiecho pulse sequences of the brain and surrounding structures were obtained without intravenous contrast.  COMPARISON:  CT of  the head without contrast 11/12/2014.  FINDINGS: Diffusion-weighted images scratch the the study is moderately degraded by patient motion.  The diffusion-weighted images demonstrate no evidence for acute or subacute infarction. Moderate generalized atrophy and mild periventricular white matter disease is evident bilaterally.  No acute hemorrhage or mass lesion is present. The ventricles are proportionate to the degree of atrophy.  Flow is present in the major intracranial arteries. The globes and orbits are intact. The paranasal sinuses and mastoid air cells are clear.  IMPRESSION: 1. No acute intracranial abnormality. 2. Moderate atrophy and mild periventricular and subcortical white matter disease. This likely reflects the sequela of chronic microvascular ischemia. 3. The study is moderately degraded by patient motion. This decreases sensitivity for small lesions.   Electronically Signed   By: San Morelle M.D.   On: 11/14/2014 09:36   US Abdomen Complete  11/14/2014   CLINICAL DATA:  Hepatitis.  Breast cancer.  EXAM: ULTRASOUND ABDOMEN COMPLETE  COMPARISON:  MRI 02/16/2007.  FINDINGS: Gallbladder: Multiple gallstones. Gallbladder wall thickness is slightly prominent 3.5 mm. Negative Murphy sign.  Common bile duct: Diameter:  Liver: 2.9 mm.  IVC: No abnormality visualized.  Pancreas: Visualized portion unremarkable.  Spleen: Size and appearance within normal limits.  Right Kidney: Length: 10.0 cm. Echogenicity within normal limits. No mass or hydronephrosis visualized. 3.9 cm cyst with thin septation consistent with benign cyst. No significant  interim change from prior MRI of 02/16/2007.  Left Kidney: Length: 8.9 cm cm. Echogenicity within normal limits. No mass or hydronephrosis visualized.  Abdominal aorta: No aneurysm visualized.  Other findings: Trace ascites.  IMPRESSION: 1. Multiple gallstones. Very minimal gallbladder wall thickening is present. Cholecystitis cannot be completely excluded. 2.  Trace ascites. 3. Stable benign appearing cyst right kidney .   Electronically Signed   By: Marcello Moores  Register   On: 11/14/2014 09:48    Assessment: 81 year old female admitted with altered mental status, nausea, vomiting, and diarrhea, now clinically improved since admission with resolution of symptoms and actually tolerating a regular diet. Associated significant transaminitis with US abdomen showing multiple gallstones, very minimal gallbladder wall thickening, unable to exclude cholecystitis. Clinically, her exam is benign, and she does not appear acutely ill at time of consultation. Due to febrile state on admission, blood cultures completed with no growth thus far. Likely dealing with microlithiasis as source for elevated LFTs. Instead of pursuing HIDA, will go directly to non-urgent surgical consultation for consideration of cholecystectomy. Patient has clinically improved since admission.    Plan: Continue supportive measures Attempt blood draw today (unable to obtain blood from patient this morning per nursing notes) Follow LFTs to baseline Surgical consult, non-urgent (order placed and secretary notified) Will continue to follow with you  Orvil Feil, ANP-BC [facility]    LOS: 3 days    11/15/2014, 8:25 AM

## 2014-11-15 NOTE — Progress Notes (Signed)
Lab technician states she was unable to successfully draw blood for labs. Notified the MD and awaiting response.

## 2014-11-15 NOTE — Consult Note (Signed)
Reason for Consult: Cholelithiasis Referring Physician: Dr. Lorriane Shire  Alexandra [name] is an 81 y.o. female.  HPI: Patient is an 81 year old pleasantly demented black female who presented to the hospital for nausea, vomiting, diarrhea. She was admitted for further workup as she is a poor historian. Her nausea, vomiting, diarrhea have resolved. She was noted to have significantly elevated LFTs, which have been normalizing. Ultrasound of gallbladder revealed cholelithiasis, minimal gallbladder wall thickening. At the present time, she is rolling around her room in no acute distress.  Past Medical History  Diagnosis Date  . Hyperlipidemia   . Cancer   . Breast cancer     Past Surgical History  Procedure Laterality Date  . Mastectomy      Family History  Problem Relation Age of Onset  . Colon cancer      unknown    Social History:  reports that she has quit smoking. She does not have any smokeless tobacco history on file. She reports that she does not drink alcohol or use illicit drugs.  Allergies:  Allergies  Allergen Reactions  . Aspirin     REACTION: "Messes up my heart and tells it to stop"  . Chocolate   . Penicillins     REACTION: Pains and feels it messes her up    Medications: I have reviewed the patient's current medications.  Results for orders placed or performed during the hospital encounter of 11/11/14 (from the past 48 hour(s))  CBC     Status: Abnormal   Collection Time: 11/14/14  5:41 AM  Result Value Ref Range   WBC 3.9 (L) 4.0 - 10.5 K/uL   RBC 3.72 (L) 3.87 - 5.11 MIL/uL   Hemoglobin 12.0 12.0 - 15.0 g/dL   HCT 35.2 (L) 36.0 - 46.0 %   MCV 94.6 78.0 - 100.0 fL   MCH 32.3 26.0 - 34.0 pg   MCHC 34.1 30.0 - 36.0 g/dL   RDW 13.3 11.5 - 15.5 %   Platelets 114 (L) 150 - 400 K/uL    Comment: SPECIMEN CHECKED FOR CLOTS CONSISTENT WITH PREVIOUS RESULT   Comprehensive metabolic panel     Status: Abnormal   Collection Time: 11/14/14  5:41 AM  Result Value Ref  Range   Sodium 145 135 - 145 mmol/L   Potassium 3.7 3.5 - 5.1 mmol/L   Chloride 110 101 - 111 mmol/L   CO2 31 22 - 32 mmol/L   Glucose, Bld 89 70 - 99 mg/dL   BUN 7 6 - 20 mg/dL   Creatinine, Ser 0.56 0.44 - 1.00 mg/dL   Calcium 8.9 8.9 - 10.3 mg/dL   Total Protein 6.0 (L) 6.5 - 8.1 g/dL   Albumin 3.2 (L) 3.5 - 5.0 g/dL   AST 165 (H) 15 - 41 U/L   ALT 256 (H) 14 - 54 U/L   Alkaline Phosphatase 119 38 - 126 U/L   Total Bilirubin 1.2 0.3 - 1.2 mg/dL   GFR calc non Af Amer >60 >60 mL/min   GFR calc Af Amer >60 >60 mL/min    Comment: (NOTE) The eGFR has been calculated using the CKD EPI equation. This calculation has not been validated in all clinical situations. eGFR's persistently <90 mL/min signify possible Chronic Kidney Disease.    Anion gap 4 (L) 5 - 15  HIV antibody     Status: None   Collection Time: 11/14/14  5:41 AM  Result Value Ref Range   HIV Screen 4th Generation wRfx Non  Reactive Non Reactive    Comment: (NOTE) Performed At: [facility] [place], [place] [id number] Lindon Romp MD [id number]   RPR     Status: None   Collection Time: 11/14/14  3:12 PM  Result Value Ref Range   RPR Ser Ql Non Reactive Non Reactive    Comment: (NOTE) Performed At: [facility] [place], [place] [id number] Lindon Romp MD [id number]   Vitamin B12     Status: Abnormal   Collection Time: 11/14/14  3:12 PM  Result Value Ref Range   Vitamin B-12 1032 (H) 180 - 914 pg/mL    Comment: (NOTE) This assay is not validated for testing neonatal or myeloproliferative syndrome specimens for Vitamin B12 levels. Performed at [facility]   Sedimentation rate     Status: None   Collection Time: 11/14/14  3:12 PM  Result Value Ref Range   Sed Rate 17 0 - 22 mm/hr  CBC     Status: Abnormal   Collection Time: 11/14/14  3:12 PM  Result Value Ref Range   WBC 3.6 (L) 4.0 - 10.5 K/uL   RBC 3.86 (L) 3.87 - 5.11 MIL/uL    Hemoglobin 12.4 12.0 - 15.0 g/dL   HCT 36.6 36.0 - 46.0 %   MCV 94.8 78.0 - 100.0 fL   MCH 32.1 26.0 - 34.0 pg   MCHC 33.9 30.0 - 36.0 g/dL   RDW 13.3 11.5 - 15.5 %   Platelets 117 (L) 150 - 400 K/uL    Comment: SPECIMEN CHECKED FOR CLOTS PLATELET COUNT CONFIRMED BY SMEAR LARGE PLATELETS PRESENT   TSH     Status: None   Collection Time: 11/14/14  3:12 PM  Result Value Ref Range   TSH 0.717 0.350 - 4.500 uIU/mL  Folate     Status: None   Collection Time: 11/14/14  3:12 PM  Result Value Ref Range   Folate 58.6 >5.9 ng/mL    Comment: RESULTS CONFIRMED BY MANUAL DILUTION Performed at [facility]   Ammonia     Status: None   Collection Time: 11/14/14  3:12 PM  Result Value Ref Range   Ammonia 22 9 - 35 umol/L  Protime-INR     Status: None   Collection Time: 11/14/14  3:12 PM  Result Value Ref Range   Prothrombin Time 13.9 11.6 - 15.2 seconds   INR 1.06 0.00 - 1.49  Hepatitis panel, acute     Status: None   Collection Time: 11/14/14  3:12 PM  Result Value Ref Range   Hepatitis B Surface Ag NEGATIVE NEGATIVE   HCV Ab NEGATIVE NEGATIVE   Hep A IgM NON REACTIVE NON REACTIVE    Comment: (NOTE) Effective May 30, 2014, Hepatitis Acute Panel (test code 22940) will be revised to automatically reflex to the Hepatitis C Viral RNA, Quantitative, Real-Time PCR assay if the Hepatitis C antibody screening result is Reactive. This action is being taken to ensure that the CDC/USPSTF recommended HCV diagnostic algorithm with the appropriate test reflex needed for accurate interpretation is followed.    Hep B C IgM NON REACTIVE NON REACTIVE    Comment: (NOTE) High levels of Hepatitis B Core IgM antibody are detectable during the acute stage of Hepatitis B. This antibody is used to differentiate current from past HBV infection. Performed at [facility]     Mr Brain Wo Contrast  11/14/2014   CLINICAL DATA:  Altered mental status.  EXAM: MRI HEAD  WITHOUT CONTRAST   TECHNIQUE: Multiplanar, multiecho pulse sequences of the brain and surrounding structures were obtained without intravenous contrast.  COMPARISON:  CT of the head without contrast 11/12/2014.  FINDINGS: Diffusion-weighted images scratch the the study is moderately degraded by patient motion.  The diffusion-weighted images demonstrate no evidence for acute or subacute infarction. Moderate generalized atrophy and mild periventricular white matter disease is evident bilaterally.  No acute hemorrhage or mass lesion is present. The ventricles are proportionate to the degree of atrophy.  Flow is present in the major intracranial arteries. The globes and orbits are intact. The paranasal sinuses and mastoid air cells are clear.  IMPRESSION: 1. No acute intracranial abnormality. 2. Moderate atrophy and mild periventricular and subcortical white matter disease. This likely reflects the sequela of chronic microvascular ischemia. 3. The study is moderately degraded by patient motion. This decreases sensitivity for small lesions.   Electronically Signed   By: San Morelle M.D.   On: 11/14/2014 09:36   US Abdomen Complete  11/14/2014   CLINICAL DATA:  Hepatitis.  Breast cancer.  EXAM: ULTRASOUND ABDOMEN COMPLETE  COMPARISON:  MRI 02/16/2007.  FINDINGS: Gallbladder: Multiple gallstones. Gallbladder wall thickness is slightly prominent 3.5 mm. Negative Murphy sign.  Common bile duct: Diameter:  Liver: 2.9 mm.  IVC: No abnormality visualized.  Pancreas: Visualized portion unremarkable.  Spleen: Size and appearance within normal limits.  Right Kidney: Length: 10.0 cm. Echogenicity within normal limits. No mass or hydronephrosis visualized. 3.9 cm cyst with thin septation consistent with benign cyst. No significant interim change from prior MRI of 02/16/2007.  Left Kidney: Length: 8.9 cm cm. Echogenicity within normal limits. No mass or hydronephrosis visualized.  Abdominal aorta: No aneurysm visualized.  Other findings:  Trace ascites.  IMPRESSION: 1. Multiple gallstones. Very minimal gallbladder wall thickening is present. Cholecystitis cannot be completely excluded. 2. Trace ascites. 3. Stable benign appearing cyst right kidney .   Electronically Signed   By: Marcello Moores  Register   On: 11/14/2014 09:48    ROS: See chart Blood pressure 157/89, pulse 78, temperature 98.7 F (37.1 C), temperature source Oral, resp. rate 18, weight 50.304 kg (110 lb 14.4 oz), SpO2 96 %. Physical Exam: Pleasant black female in no acute distress. Abdomen is soft, nontender, nondistended. A lower midline surgical scar is present. No rigidity is noted.  Assessment/Plan: Impression: Cholelithiasis with normalizing liver enzyme tests. Currently asymptomatic. Plan: We'll discuss with Dr. Lorriane Shire further management and treatment. She is at higher risk for general endotracheal anesthesia given her age.  She does not understand any description of surgical intervention and its risks and benefits, thus I would need to talk to family should we decide to proceed with surgery. No acute surgical intervention needed at this time.  [place] A 11/15/2014, 2:03 PM

## 2014-11-15 NOTE — Progress Notes (Signed)
Pt has had an altered mental status change. Pt became verbally and physically aggressive with staff, upon removing her iv. Notified Dr. Willey Blade. MD states to leave iv out at this time. Sitter and nurse will continue to provide emotional support for pt.

## 2014-11-16 LAB — CBC WITH DIFFERENTIAL/PLATELET
Basophils Absolute: 0 10*3/uL (ref 0.0–0.1)
Basophils Relative: 1 % (ref 0–1)
Eosinophils Absolute: 0.3 10*3/uL (ref 0.0–0.7)
Eosinophils Relative: 8 % — ABNORMAL HIGH (ref 0–5)
HCT: 36.3 % (ref 36.0–46.0)
Hemoglobin: 12.5 g/dL (ref 12.0–15.0)
LYMPHS ABS: 1.4 10*3/uL (ref 0.7–4.0)
LYMPHS PCT: 35 % (ref 12–46)
MCH: 32.7 pg (ref 26.0–34.0)
MCHC: 34.4 g/dL (ref 30.0–36.0)
MCV: 95 fL (ref 78.0–100.0)
Monocytes Absolute: 0.7 10*3/uL (ref 0.1–1.0)
Monocytes Relative: 16 % — ABNORMAL HIGH (ref 3–12)
NEUTROS ABS: 1.7 10*3/uL (ref 1.7–7.7)
Neutrophils Relative %: 40 % — ABNORMAL LOW (ref 43–77)
PLATELETS: 136 10*3/uL — AB (ref 150–400)
RBC: 3.82 MIL/uL — AB (ref 3.87–5.11)
RDW: 13.5 % (ref 11.5–15.5)
WBC: 4.1 10*3/uL (ref 4.0–10.5)

## 2014-11-16 LAB — BASIC METABOLIC PANEL
Anion gap: 7 (ref 5–15)
BUN: 8 mg/dL (ref 6–20)
CO2: 31 mmol/L (ref 22–32)
Calcium: 9.2 mg/dL (ref 8.9–10.3)
Chloride: 105 mmol/L (ref 101–111)
Creatinine, Ser: 0.55 mg/dL (ref 0.44–1.00)
GFR calc Af Amer: >60 mL/min (ref >60)
GFR calc non Af Amer: >60 mL/min (ref >60)
GLUCOSE: 80 mg/dL (ref 70–99)
Potassium: 3.4 mmol/L — ABNORMAL LOW (ref 3.5–5.1)
SODIUM: 143 mmol/L (ref 135–145)

## 2014-11-16 LAB — HEPATIC FUNCTION PANEL
ALBUMIN: 3.4 g/dL — AB (ref 3.5–5.0)
ALK PHOS: 112 U/L (ref 38–126)
ALT: 146 U/L — ABNORMAL HIGH (ref 14–54)
AST: 59 U/L — ABNORMAL HIGH (ref 15–41)
Bilirubin, Direct: 0.3 mg/dL (ref 0.1–0.5)
Indirect Bilirubin: 0.7 mg/dL (ref 0.3–0.9)
TOTAL PROTEIN: 6.4 g/dL — AB (ref 6.5–8.1)
Total Bilirubin: 1 mg/dL (ref 0.3–1.2)

## 2014-11-16 MED ORDER — DONEPEZIL HCL 10 MG PO TABS: 10.0000 mg | ORAL_TABLET | Freq: Every day | ORAL | Status: DC

## 2014-11-16 NOTE — Progress Notes (Signed)
UR chart review completed.  

## 2014-11-16 NOTE — Plan of Care (Signed)
Problem: Discharge Progression Outcomes Goal: Complications resolved/controlled Outcome: Progressing Pt is confused

## 2014-11-16 NOTE — Discharge Summary (Signed)
Physician Discharge Summary  Alexandra [name]:[id number] DOB: [birth year] DOA: 11/11/2014  PCP: Maricela Curet, MD  Admit date: 11/11/2014 Discharge date: 11/16/2014   Recommendations for Outpatient Follow-up:  The patient will follow my office within one week's time to assess response to Aricept and cognitive dysfunction due to chronic dementia and likewise have her LFTs assessed at that time Discharge Diagnoses:  Principal Problem:   Altered mental status Active Problems:   Gastroenteritis   Fever   Elevated LFTs   Discharge Condition: Good  Filed Weights   11/11/14 2245 11/12/14 0253  Weight: 125 lb (56.7 kg) 110 lb 14.4 oz (50.304 kg)    History of present illness:  Patient 81 year old black female resident of assisted living facility who has chronic dementia made with worsening altered mental status septicemia workup was negative. No evidence of infection or leukocytosis did liver function tests and a gallbladder ultrasound was done revealing cholelithiasis and questionable gallbladder wall thickening but no dilatation of the duct was seen in consultation by gastroenterology episodes A, B, and C serologies were negative liver enzymes is no evidence of hepatic steatosis based on sonogram was seen in consultation by surgery and felt surgical intervention for cholelithiasis without clinical cholecystitis was on warranted at present time she had the addition of Aricept 10 mg at bedtime replaced and she will follow-up as an outpatient to see how significant this affects her chronic dementia it was her dementia workup was essentially unrevealing in the hospital and CT scan revealed no evidence of acute infarct  Hospital Course:  See history of present illness above  Procedures:     Consultations:  Gastroenterology and general surgery  Discharge Instructions  Discharge Instructions    Discharge instructions    Complete by:  As directed      Discharge patient    Complete  by:  As directed             Medication List    TAKE these medications        donepezil 10 MG tablet  Commonly known as:  ARICEPT  Take 1 tablet (10 mg total) by mouth at bedtime.     multivitamin with minerals Tabs tablet  Take 1 tablet by mouth daily.     omeprazole 20 MG capsule  Commonly known as:  PRILOSEC  Take 20 mg by mouth daily.     pravastatin 40 MG tablet  Commonly known as:  PRAVACHOL  Take 40 mg by mouth daily.       Allergies  Allergen Reactions  . Aspirin     REACTION: "Messes up my heart and tells it to stop"  . Chocolate   . Penicillins     REACTION: Pains and feels it messes her up      The results of significant diagnostics from this hospitalization (including imaging, microbiology, ancillary and laboratory) are listed below for reference.    Significant Diagnostic Studies: Ct Head Wo Contrast  11/12/2014   CLINICAL DATA:  Acute onset of altered mental status. Initial encounter.  EXAM: CT HEAD WITHOUT CONTRAST  TECHNIQUE: Contiguous axial images were obtained from the base of the skull through the vertex without intravenous contrast.  COMPARISON:  CT of the head performed 07/22/2003  FINDINGS: There is no evidence of acute infarction, mass lesion, or intra- or extra-axial hemorrhage on CT.  Prominence of the ventricles sulci reflects moderate cortical volume loss. Mild periventricular white matter change may reflect small vessel ischemic microangiopathy. Evaluation is mildly  suboptimal due to motion artifact.  The brainstem and fourth ventricle are within normal limits. The basal ganglia are unremarkable in appearance. The cerebral hemispheres demonstrate grossly normal gray-white differentiation. No mass effect or midline shift is seen.  There is no evidence of fracture; visualized osseous structures are unremarkable in appearance. The visualized portions of the orbits are within normal limits. The paranasal sinuses and mastoid air cells are  well-aerated. No significant soft tissue abnormalities are seen.  IMPRESSION: 1. No acute intracranial pathology seen on CT. 2. Moderate cortical volume loss and scattered small vessel ischemic microangiopathy.   Electronically Signed   By: Garald Balding M.D.   On: 11/12/2014 01:39   Mr Brain Wo Contrast  11/14/2014   CLINICAL DATA:  Altered mental status.  EXAM: MRI HEAD WITHOUT CONTRAST  TECHNIQUE: Multiplanar, multiecho pulse sequences of the brain and surrounding structures were obtained without intravenous contrast.  COMPARISON:  CT of the head without contrast 11/12/2014.  FINDINGS: Diffusion-weighted images scratch the the study is moderately degraded by patient motion.  The diffusion-weighted images demonstrate no evidence for acute or subacute infarction. Moderate generalized atrophy and mild periventricular white matter disease is evident bilaterally.  No acute hemorrhage or mass lesion is present. The ventricles are proportionate to the degree of atrophy.  Flow is present in the major intracranial arteries. The globes and orbits are intact. The paranasal sinuses and mastoid air cells are clear.  IMPRESSION: 1. No acute intracranial abnormality. 2. Moderate atrophy and mild periventricular and subcortical white matter disease. This likely reflects the sequela of chronic microvascular ischemia. 3. The study is moderately degraded by patient motion. This decreases sensitivity for small lesions.   Electronically Signed   By: San Morelle M.D.   On: 11/14/2014 09:36   US Abdomen Complete  11/14/2014   CLINICAL DATA:  Hepatitis.  Breast cancer.  EXAM: ULTRASOUND ABDOMEN COMPLETE  COMPARISON:  MRI 02/16/2007.  FINDINGS: Gallbladder: Multiple gallstones. Gallbladder wall thickness is slightly prominent 3.5 mm. Negative Murphy sign.  Common bile duct: Diameter:  Liver: 2.9 mm.  IVC: No abnormality visualized.  Pancreas: Visualized portion unremarkable.  Spleen: Size and appearance within normal  limits.  Right Kidney: Length: 10.0 cm. Echogenicity within normal limits. No mass or hydronephrosis visualized. 3.9 cm cyst with thin septation consistent with benign cyst. No significant interim change from prior MRI of 02/16/2007.  Left Kidney: Length: 8.9 cm cm. Echogenicity within normal limits. No mass or hydronephrosis visualized.  Abdominal aorta: No aneurysm visualized.  Other findings: Trace ascites.  IMPRESSION: 1. Multiple gallstones. Very minimal gallbladder wall thickening is present. Cholecystitis cannot be completely excluded. 2. Trace ascites. 3. Stable benign appearing cyst right kidney .   Electronically Signed   By: Marcello Moores  Register   On: 11/14/2014 09:48   Dg Chest Port 1 View  11/11/2014   CLINICAL DATA:  Altered mental status.  History of breast cancer.  EXAM: PORTABLE CHEST - 1 VIEW  COMPARISON:  Chest radiograph January 28, 2007  FINDINGS: Cardiac silhouette is normal. Mildly calcified aortic knob. The lungs are clear without pleural effusions or focal consolidations. Trachea projects midline and there is no pneumothorax. Soft tissue planes and included osseous structures are non-suspicious.  IMPRESSION: No acute cardiopulmonary process.   Electronically Signed   By: Elon Alas   On: 11/11/2014 23:53    Microbiology: Recent Results (from the past 240 hour(s))  Blood culture (routine x 2)     Status: None (Preliminary result)   Collection  Time: 11/12/14 12:51 AM  Result Value Ref Range Status   Specimen Description LEFT ANTECUBITAL  Final   Special Requests BOTTLES DRAWN AEROBIC ONLY 6CC AEB  Final   Culture NO GROWTH 3 DAYS  Final   Report Status PENDING  Incomplete  Blood culture (routine x 2)     Status: None (Preliminary result)   Collection Time: 11/12/14  2:01 AM  Result Value Ref Range Status   Specimen Description LEFT ANTECUBITAL  Final   Special Requests BOTTLES DRAWN AEROBIC ONLY 4CC  Final   Culture NO GROWTH 3 DAYS  Final   Report Status PENDING   Incomplete     Labs: Basic Metabolic Panel:  Recent Labs Lab 11/11/14 2314 11/13/14 0658 11/14/14 0541 11/15/14 1529  NA 140 141 145 143  K 4.4 3.8 3.7 4.0  CL 104 109 110 105  CO2 29 28 31 29   GLUCOSE 139* 92 89 84  BUN 15 8 7 9   CREATININE 0.60 0.60 0.56 0.58  CALCIUM 9.3 8.3* 8.9 9.3   Liver Function Tests:  Recent Labs Lab 11/12/14 0201 11/13/14 0658 11/14/14 0541 11/15/14 1529  AST 1069* 306* 165* 74*  ALT 569* 319* 256* 179*  ALKPHOS 104 93 119 125  BILITOT 2.8* 1.8* 1.2 0.8  PROT 6.7 5.5* 6.0* 6.8  ALBUMIN 3.8 3.0* 3.2* 3.7   No results for input(s): LIPASE, AMYLASE in the last 168 hours.  Recent Labs Lab 11/14/14 1512  AMMONIA 22   CBC:  Recent Labs Lab 11/11/14 2314 11/12/14 0201 11/13/14 0658 11/14/14 0541 11/14/14 1512  WBC 7.7 9.2 4.6 3.9* 3.6*  NEUTROABS 6.6 7.7  --   --   --   HGB 12.4 12.4 10.9* 12.0 12.4  HCT 37.4 36.6 31.9* 35.2* 36.6  MCV 96.1 95.8 95.5 94.6 94.8  PLT 35* 109* 95* 114* 117*   Cardiac Enzymes: No results for input(s): CKTOTAL, CKMB, CKMBINDEX, TROPONINI in the last 168 hours. BNP: BNP (last 3 results) No results for input(s): BNP in the last 8760 hours.  ProBNP (last 3 results) No results for input(s): PROBNP in the last 8760 hours.  CBG: No results for input(s): GLUCAP in the last 168 hours.     Signed:  Keyetta Hollingworth Jerilynn Mages  [facility] Pager: [phone] 11/16/2014, 6:48 AM

## 2014-11-16 NOTE — Clinical Social Work Note (Signed)
Pt d/c today back to [facility]. Son and facility aware and agreeable. Facility notified that pt will require some assist with ADLs and that she remains confused. They are aware of follow up with PCP in less than a week regarding this. Facility will provide transport.  Benay Pike, [place]

## 2014-11-16 NOTE — Care Management Note (Signed)
Case Management Note  Patient Details  Name: Alexandra [name] MRN: [id number] Date of Birth: May 25, 1926  Subjective/Objective:                    Action/Plan:   Expected Discharge Date:  11/17/14               Expected Discharge Plan:  Assisted Living / Rest Home  In-House Referral:  Clinical Social Work  Discharge planning Services  CM Consult  Post Acute Care Choice:    Choice offered to:  NA  DME Arranged:    DME Agency:     HH Arranged:    [place] Agency:     Status of Service:  Completed, signed off  Medicare Important Message Given:  Yes Date Medicare IM Given:  11/16/14 Medicare IM give by:  Jolene Provost, RN, MSN, CM  Date Additional Medicare IM Given:    Additional Medicare Important Message give by:     If discussed at [place] of Stay Meetings, dates discussed:    Additional Comments: Pt discharging to ALF today. CSW has arranged for return to facility. No CM needs.   Sherald Barge, RN 11/16/2014, 11:27 AM

## 2014-11-17 LAB — CULTURE, BLOOD (ROUTINE X 2)
CULTURE: NO GROWTH
CULTURE: NO GROWTH

## 2015-07-11 ENCOUNTER — Encounter (HOSPITAL_COMMUNITY): Payer: Self-pay | Admitting: Cardiology

## 2015-07-11 ENCOUNTER — Inpatient Hospital Stay (HOSPITAL_COMMUNITY)
Admission: EM | Admit: 2015-07-11 | Discharge: 2015-07-14 | DRG: 311 | Disposition: A | Discharge status: Nursing Home (Includes ICF) | Payer: Medicare Other | Attending: Family Medicine | Admitting: Family Medicine

## 2015-07-11 ENCOUNTER — Emergency Department (HOSPITAL_COMMUNITY): Payer: Medicare Other

## 2015-07-11 DIAGNOSIS — W19XXXA Unspecified fall, initial encounter: Secondary | ICD-10-CM | POA: Diagnosis not present

## 2015-07-11 DIAGNOSIS — Z9012 Acquired absence of left breast and nipple: Secondary | ICD-10-CM | POA: Diagnosis not present

## 2015-07-11 DIAGNOSIS — Z853 Personal history of malignant neoplasm of breast: Secondary | ICD-10-CM | POA: Diagnosis not present

## 2015-07-11 DIAGNOSIS — W1830XA Fall on same level, unspecified, initial encounter: Secondary | ICD-10-CM | POA: Diagnosis present

## 2015-07-11 DIAGNOSIS — S0083XA Contusion of other part of head, initial encounter: Secondary | ICD-10-CM | POA: Diagnosis present

## 2015-07-11 DIAGNOSIS — Z681 Body mass index (BMI) 19 or less, adult: Secondary | ICD-10-CM | POA: Diagnosis not present

## 2015-07-11 DIAGNOSIS — H9190 Unspecified hearing loss, unspecified ear: Secondary | ICD-10-CM | POA: Diagnosis present

## 2015-07-11 DIAGNOSIS — I214 Non-ST elevation (NSTEMI) myocardial infarction: Secondary | ICD-10-CM | POA: Insufficient documentation

## 2015-07-11 DIAGNOSIS — F0390 Unspecified dementia without behavioral disturbance: Secondary | ICD-10-CM | POA: Diagnosis not present

## 2015-07-11 DIAGNOSIS — Z87891 Personal history of nicotine dependence: Secondary | ICD-10-CM

## 2015-07-11 DIAGNOSIS — S0003XA Contusion of scalp, initial encounter: Secondary | ICD-10-CM

## 2015-07-11 DIAGNOSIS — Z66 Do not resuscitate: Secondary | ICD-10-CM | POA: Diagnosis present

## 2015-07-11 DIAGNOSIS — I248 Other forms of acute ischemic heart disease: Secondary | ICD-10-CM | POA: Diagnosis present

## 2015-07-11 DIAGNOSIS — R64 Cachexia: Secondary | ICD-10-CM | POA: Diagnosis present

## 2015-07-11 DIAGNOSIS — I4510 Unspecified right bundle-branch block: Secondary | ICD-10-CM | POA: Diagnosis present

## 2015-07-11 DIAGNOSIS — D696 Thrombocytopenia, unspecified: Secondary | ICD-10-CM | POA: Diagnosis present

## 2015-07-11 DIAGNOSIS — E785 Hyperlipidemia, unspecified: Secondary | ICD-10-CM | POA: Diagnosis not present

## 2015-07-11 DIAGNOSIS — R079 Chest pain, unspecified: Secondary | ICD-10-CM | POA: Diagnosis not present

## 2015-07-11 DIAGNOSIS — Y92129 Unspecified place in nursing home as the place of occurrence of the external cause: Secondary | ICD-10-CM | POA: Diagnosis not present

## 2015-07-11 DIAGNOSIS — Z800 Family history of malignant neoplasm of digestive organs: Secondary | ICD-10-CM

## 2015-07-11 DIAGNOSIS — G8929 Other chronic pain: Secondary | ICD-10-CM | POA: Diagnosis present

## 2015-07-11 DIAGNOSIS — Z886 Allergy status to analgesic agent status: Secondary | ICD-10-CM

## 2015-07-11 DIAGNOSIS — M542 Cervicalgia: Secondary | ICD-10-CM | POA: Diagnosis present

## 2015-07-11 DIAGNOSIS — M549 Dorsalgia, unspecified: Secondary | ICD-10-CM | POA: Diagnosis present

## 2015-07-11 DIAGNOSIS — R001 Bradycardia, unspecified: Secondary | ICD-10-CM | POA: Diagnosis present

## 2015-07-11 DIAGNOSIS — R7989 Other specified abnormal findings of blood chemistry: Secondary | ICD-10-CM

## 2015-07-11 DIAGNOSIS — R778 Other specified abnormalities of plasma proteins: Secondary | ICD-10-CM | POA: Diagnosis present

## 2015-07-11 DIAGNOSIS — Y92009 Unspecified place in unspecified non-institutional (private) residence as the place of occurrence of the external cause: Secondary | ICD-10-CM

## 2015-07-11 HISTORY — DX: Cervicalgia: M54.2

## 2015-07-11 HISTORY — DX: Other chronic pain: G89.29

## 2015-07-11 HISTORY — DX: Irritable bowel syndrome, unspecified: K58.9

## 2015-07-11 HISTORY — DX: Unspecified cataract: H26.9

## 2015-07-11 HISTORY — DX: Dorsalgia, unspecified: M54.9

## 2015-07-11 HISTORY — DX: Unspecified dementia, unspecified severity, without behavioral disturbance, psychotic disturbance, mood disturbance, and anxiety: F03.90

## 2015-07-11 LAB — URINALYSIS, ROUTINE W REFLEX MICROSCOPIC
BILIRUBIN URINE: NEGATIVE
GLUCOSE, UA: NEGATIVE mg/dL
Hgb urine dipstick: NEGATIVE
Leukocytes, UA: NEGATIVE
NITRITE: NEGATIVE
PH: 5.5 (ref 5.0–8.0)
Protein, ur: NEGATIVE mg/dL
Specific Gravity, Urine: >1.03 — ABNORMAL HIGH (ref 1.005–1.030)

## 2015-07-11 LAB — CBC WITH DIFFERENTIAL/PLATELET
Basophils Absolute: 0 10*3/uL (ref 0.0–0.1)
Basophils Relative: 1 %
EOS PCT: 4 %
Eosinophils Absolute: 0.2 10*3/uL (ref 0.0–0.7)
HEMATOCRIT: 41.7 % (ref 36.0–46.0)
Hemoglobin: 14.1 g/dL (ref 12.0–15.0)
LYMPHS ABS: 1.3 10*3/uL (ref 0.7–4.0)
LYMPHS PCT: 31 %
MCH: 31.8 pg (ref 26.0–34.0)
MCHC: 33.8 g/dL (ref 30.0–36.0)
MCV: 94.1 fL (ref 78.0–100.0)
MONO ABS: 0.5 10*3/uL (ref 0.1–1.0)
MONOS PCT: 12 %
Neutro Abs: 2.2 10*3/uL (ref 1.7–7.7)
Neutrophils Relative %: 52 %
Platelets: 107 10*3/uL — ABNORMAL LOW (ref 150–400)
RBC: 4.43 MIL/uL (ref 3.87–5.11)
RDW: 12.7 % (ref 11.5–15.5)
Smear Review: DECREASED
WBC: 4.1 10*3/uL (ref 4.0–10.5)

## 2015-07-11 LAB — BASIC METABOLIC PANEL
Anion gap: 9 (ref 5–15)
BUN: 19 mg/dL (ref 6–20)
CALCIUM: 9.5 mg/dL (ref 8.9–10.3)
CO2: 30 mmol/L (ref 22–32)
CREATININE: 0.75 mg/dL (ref 0.44–1.00)
Chloride: 103 mmol/L (ref 101–111)
GFR calc Af Amer: >60 mL/min (ref >60)
GFR calc non Af Amer: >60 mL/min (ref >60)
GLUCOSE: 82 mg/dL (ref 65–99)
Potassium: 4 mmol/L (ref 3.5–5.1)
Sodium: 142 mmol/L (ref 135–145)

## 2015-07-11 LAB — LACTIC ACID, PLASMA
Lactic Acid, Venous: 1.3 mmol/L (ref 0.5–2.0)
Lactic Acid, Venous: 1.7 mmol/L (ref 0.5–2.0)

## 2015-07-11 LAB — TROPONIN I: Troponin I: 0.26 ng/mL — ABNORMAL HIGH (ref <0.031)

## 2015-07-11 MED ORDER — PRAVASTATIN SODIUM 40 MG PO TABS
40.0000 mg | ORAL_TABLET | Freq: Every day | ORAL | Status: DC
  Administered 2015-07-12 – 2015-07-14 (×3): 40 mg via ORAL
  Filled 2015-07-11 (×3): qty 1

## 2015-07-11 MED ORDER — SODIUM CHLORIDE 0.9 % IJ SOLN
3.0000 mL | Freq: Two times a day (BID) | INTRAMUSCULAR | Status: DC
  Administered 2015-07-11: 3 mL via INTRAVENOUS

## 2015-07-11 MED ORDER — ENOXAPARIN SODIUM 30 MG/0.3ML ~~LOC~~ SOLN
30.0000 mg | SUBCUTANEOUS | Status: DC
  Administered 2015-07-12 – 2015-07-14 (×3): 30 mg via SUBCUTANEOUS
  Filled 2015-07-11 (×3): qty 0.3

## 2015-07-11 MED ORDER — ONDANSETRON HCL 4 MG/2ML IJ SOLN: 4.0000 mg | Freq: Four times a day (QID) | INTRAMUSCULAR | Status: DC | PRN

## 2015-07-11 MED ORDER — ACETAMINOPHEN 650 MG RE SUPP: 650.0000 mg | Freq: Four times a day (QID) | RECTAL | Status: DC | PRN

## 2015-07-11 MED ORDER — PANTOPRAZOLE SODIUM 40 MG PO TBEC
40.0000 mg | DELAYED_RELEASE_TABLET | Freq: Every day | ORAL | Status: DC
  Administered 2015-07-12 – 2015-07-14 (×3): 40 mg via ORAL
  Filled 2015-07-11 (×3): qty 1

## 2015-07-11 MED ORDER — SODIUM CHLORIDE 0.9 % IV SOLN
INTRAVENOUS | Status: AC
Start: 2015-07-11 — End: 2015-07-12

## 2015-07-11 MED ORDER — SODIUM CHLORIDE 0.9 % IV SOLN
INTRAVENOUS | Status: DC
  Administered 2015-07-11 – 2015-07-14 (×6): via INTRAVENOUS

## 2015-07-11 MED ORDER — ACETAMINOPHEN 325 MG PO TABS: 650.0000 mg | ORAL_TABLET | Freq: Four times a day (QID) | ORAL | Status: DC | PRN

## 2015-07-11 MED ORDER — HYDROCODONE-ACETAMINOPHEN 5-325 MG PO TABS: 1.0000 | ORAL_TABLET | ORAL | Status: DC | PRN

## 2015-07-11 MED ORDER — ONDANSETRON HCL 4 MG PO TABS: 4.0000 mg | ORAL_TABLET | Freq: Four times a day (QID) | ORAL | Status: DC | PRN

## 2015-07-11 MED ORDER — ADULT MULTIVITAMIN W/MINERALS CH
1.0000 | ORAL_TABLET | Freq: Every day | ORAL | Status: DC
  Administered 2015-07-12 – 2015-07-14 (×3): 1 via ORAL
  Filled 2015-07-11 (×3): qty 1

## 2015-07-11 MED ORDER — SODIUM CHLORIDE 0.9 % IV BOLUS (SEPSIS)
500.0000 mL | Freq: Once | INTRAVENOUS | Status: AC
  Administered 2015-07-11: 1000 mL via INTRAVENOUS

## 2015-07-11 NOTE — H&P (Signed)
[facility]                                                                                    Patient Demographics  Alexandra [name], is a 81 y.o. female  CSN: [id number]  MRN: [id number]  DOB - 1925/11/26  Admit Date - 07/11/2015  Outpatient Primary MD for the patient is Maricela Curet, MD   With History of -  Past Medical History  Diagnosis Date  . Hyperlipidemia   . Cancer ([place])   . Breast cancer ([place])   . Dementia   . Chronic neck and back pain   . Cataract   . IBS (irritable bowel syndrome)       Past Surgical History  Procedure Laterality Date  . Mastectomy      in for   Chief Complaint  Patient presents with  . Fall     HPI  Alexandra [name]  is a 81 y.o. female, with past medical history significant for dementia and hyperlipidemia brought to the emergency room today after a fall at home with head trauma. Head CT showed no acute abnormality. Patient is very hard of hearing and confused, but does not complain of any chest pains. Patient hit her head on the walker and has significant hematoma. No previous history of heart disease, patient is allergic to aspirin. Troponin done in the emergency room was elevated and I was called to admit .    Review of Systems    Unable to obtain due to mental status  Social History Social History  Substance Use Topics  . Smoking status: Former Research scientist (life sciences)  . Smokeless tobacco: Not on file  . Alcohol Use: No     Comment: in the past per patient     Family History Family History  Problem Relation Age of Onset  . Colon cancer      unknown     Prior to Admission medications   Medication Sig Start Date End Date Taking? Authorizing Provider  donepezil (ARICEPT) 10 MG tablet Take 1 tablet (10 mg total) by mouth at bedtime. 11/16/14   Lucia Gaskins, MD  Multiple Vitamin (MULTIVITAMIN WITH MINERALS) TABS tablet Take 1 tablet by mouth daily.    Historical Provider, MD  omeprazole (PRILOSEC) 20 MG capsule Take 20 mg  by mouth daily. 10/20/14   Historical Provider, MD  pravastatin (PRAVACHOL) 40 MG tablet Take 40 mg by mouth daily. 10/20/14   Historical Provider, MD    Allergies  Allergen Reactions  . Aspirin     REACTION: "Messes up my heart and tells it to stop"  . Chocolate   . Penicillins     REACTION: Pains and feels it messes her up    Physical Exam  Vitals  Blood pressure 116/62, pulse 91, temperature 98 F (36.7 C), temperature source Oral, resp. rate 16, height 5\' 3"  (1.6 m), weight 49.896 kg (110 lb), SpO2 92 %.   1. General elderly lady, very pleasant, in no acute distress  2. Pleasantly confused.  3. No F.N deficits, grossly, patient moving all extremities  4. Ears and Eyes appear Normal, Conjunctivae clear, PERRLA. Moist Oral Mucosa, frontal hematoma noted.  5. Supple Neck, No JVD, No cervical lymphadenopathy appriciated, No Carotid Bruits.  6. Symmetrical Chest wall movement, Good air movement bilaterally, decreased breath sounds at the bases.  7. RRR, No Gallops, Rubs or Murmurs, No Parasternal Heave.  8. Positive Bowel Sounds, Abdomen Soft, Non tender, No organomegaly appriciated,No rebound -guarding or rigidity.  9.  No Cyanosis, Normal Skin Turgor, No Skin Rash or Bruise.  10. Good muscle tone,  joints appear normal , no effusions, Normal ROM.    Data Review  CBC  Recent Labs Lab 07/11/15 1925  WBC 4.1  HGB 14.1  HCT 41.7  PLT 107*  MCV 94.1  MCH 31.8  MCHC 33.8  RDW 12.7  LYMPHSABS 1.3  MONOABS 0.5  EOSABS 0.2  BASOSABS 0.0   ------------------------------------------------------------------------------------------------------------------  Chemistries   Recent Labs Lab 07/11/15 1925  NA 142  K 4.0  CL 103  CO2 30  GLUCOSE 82  BUN 19  CREATININE 0.75  CALCIUM 9.5   ------------------------------------------------------------------------------------------------------------------ estimated creatinine clearance is 39 mL/min (by C-G formula  based on Cr of 0.75). ------------------------------------------------------------------------------------------------------------------ No results for input(s): TSH, T4TOTAL, T3FREE, THYROIDAB in the last 72 hours.  Invalid input(s): FREET3   Coagulation profile No results for input(s): INR, PROTIME in the last 168 hours. ------------------------------------------------------------------------------------------------------------------- No results for input(s): DDIMER in the last 72 hours. -------------------------------------------------------------------------------------------------------------------  Cardiac Enzymes  Recent Labs Lab 07/11/15 1925  TROPONINI 0.26*   ------------------------------------------------------------------------------------------------------------------ Invalid input(s): POCBNP   ---------------------------------------------------------------------------------------------------------------  Urinalysis    Component Value Date/Time   COLORURINE YELLOW 11/11/2014 2320   APPEARANCEUR CLEAR 11/11/2014 2320   LABSPEC 1.020 11/11/2014 2320   PHURINE 8.0 11/11/2014 2320   GLUCOSEU NEGATIVE 11/11/2014 2320   HGBUR TRACE* 11/11/2014 2320   BILIRUBINUR SMALL* 11/11/2014 2320   KETONESUR NEGATIVE 11/11/2014 2320   PROTEINUR 30* 11/11/2014 2320   UROBILINOGEN 4.0* 11/11/2014 2320   NITRITE NEGATIVE 11/11/2014 2320   LEUKOCYTESUR NEGATIVE 11/11/2014 2320    ----------------------------------------------------------------------------------------------------------------   Imaging results:   Ct Head Wo Contrast  07/11/2015  CLINICAL DATA:  Recent fall with head and face trauma, initial encounter EXAM: CT HEAD WITHOUT CONTRAST CT MAXILLOFACIAL WITHOUT CONTRAST CT CERVICAL SPINE WITHOUT CONTRAST TECHNIQUE: Multidetector CT imaging of the head, cervical spine, and maxillofacial structures were performed using the standard protocol without intravenous  contrast. Multiplanar CT image reconstructions of the cervical spine and maxillofacial structures were also generated. COMPARISON:  11/12/2014 FINDINGS: CT HEAD FINDINGS Ventricular dilatation is again identified. No focal mass lesion, hemorrhage or acute infarction is noted. No acute bony abnormality is noted. Mild soft tissue swelling is noted in the left frontal region. CT MAXILLOFACIAL FINDINGS No acute bony abnormality is noted. The paranasal sinuses and mastoid air cells are within normal limits. No gross soft tissue abnormality is seen. CT CERVICAL SPINE FINDINGS Seven cervical segments are well visualized. Vertebral body height is well maintained. Disc space narrowing is noted at all levels from C3-C7. Heavy facet hypertrophic changes and osteophytic changes are noted on the right with resultant neural foraminal narrowing and central canal stenosis. No acute fracture or acute facet abnormality is noted. The surrounding soft tissues are within normal limits. IMPRESSION: CT of the head: Chronic ventricular dilatation. Mild soft tissue swelling in the left frontal region. No acute intracranial abnormality is noted. CT of maxillofacial bones:  No acute fracture noted. CT of the cervical spine: Multilevel degenerative change without acute abnormality. Electronically Signed   By: Inez Catalina M.D.   On: 07/11/2015 18:31   Ct  Cervical Spine Wo Contrast  07/11/2015  CLINICAL DATA:  Recent fall with head and face trauma, initial encounter EXAM: CT HEAD WITHOUT CONTRAST CT MAXILLOFACIAL WITHOUT CONTRAST CT CERVICAL SPINE WITHOUT CONTRAST TECHNIQUE: Multidetector CT imaging of the head, cervical spine, and maxillofacial structures were performed using the standard protocol without intravenous contrast. Multiplanar CT image reconstructions of the cervical spine and maxillofacial structures were also generated. COMPARISON:  11/12/2014 FINDINGS: CT HEAD FINDINGS Ventricular dilatation is again identified. No focal  mass lesion, hemorrhage or acute infarction is noted. No acute bony abnormality is noted. Mild soft tissue swelling is noted in the left frontal region. CT MAXILLOFACIAL FINDINGS No acute bony abnormality is noted. The paranasal sinuses and mastoid air cells are within normal limits. No gross soft tissue abnormality is seen. CT CERVICAL SPINE FINDINGS Seven cervical segments are well visualized. Vertebral body height is well maintained. Disc space narrowing is noted at all levels from C3-C7. Heavy facet hypertrophic changes and osteophytic changes are noted on the right with resultant neural foraminal narrowing and central canal stenosis. No acute fracture or acute facet abnormality is noted. The surrounding soft tissues are within normal limits. IMPRESSION: CT of the head: Chronic ventricular dilatation. Mild soft tissue swelling in the left frontal region. No acute intracranial abnormality is noted. CT of maxillofacial bones:  No acute fracture noted. CT of the cervical spine: Multilevel degenerative change without acute abnormality. Electronically Signed   By: Inez Catalina M.D.   On: 07/11/2015 18:31   Ct Maxillofacial Wo Cm  07/11/2015  CLINICAL DATA:  Recent fall with head and face trauma, initial encounter EXAM: CT HEAD WITHOUT CONTRAST CT MAXILLOFACIAL WITHOUT CONTRAST CT CERVICAL SPINE WITHOUT CONTRAST TECHNIQUE: Multidetector CT imaging of the head, cervical spine, and maxillofacial structures were performed using the standard protocol without intravenous contrast. Multiplanar CT image reconstructions of the cervical spine and maxillofacial structures were also generated. COMPARISON:  11/12/2014 FINDINGS: CT HEAD FINDINGS Ventricular dilatation is again identified. No focal mass lesion, hemorrhage or acute infarction is noted. No acute bony abnormality is noted. Mild soft tissue swelling is noted in the left frontal region. CT MAXILLOFACIAL FINDINGS No acute bony abnormality is noted. The paranasal  sinuses and mastoid air cells are within normal limits. No gross soft tissue abnormality is seen. CT CERVICAL SPINE FINDINGS Seven cervical segments are well visualized. Vertebral body height is well maintained. Disc space narrowing is noted at all levels from C3-C7. Heavy facet hypertrophic changes and osteophytic changes are noted on the right with resultant neural foraminal narrowing and central canal stenosis. No acute fracture or acute facet abnormality is noted. The surrounding soft tissues are within normal limits. IMPRESSION: CT of the head: Chronic ventricular dilatation. Mild soft tissue swelling in the left frontal region. No acute intracranial abnormality is noted. CT of maxillofacial bones:  No acute fracture noted. CT of the cervical spine: Multilevel degenerative change without acute abnormality. Electronically Signed   By: Inez Catalina M.D.   On: 07/11/2015 18:31    My personal review of EKG: Sinus bradycardia 52 bpm/right bundle branch block    Assessment & Plan  1.NSTEMI    Could not give aspirin/patient's allergic    Continue with statin    Check echocardiogram    Serial troponins     Consult cardiology in a.m.  2. Dementia/severe     Hold Aricept due to falls  3. Mild thrombocytopenia    DVT Prophylaxis lovenox  AM Labs Ordered, also please review Full Orders  Code Status full  Disposition Plan: unknown  Time spent in minutes : 38 min  Condition GUARDED   @SIGNATURE @

## 2015-07-11 NOTE — ED Provider Notes (Signed)
CSN: [id number]     Arrival date & time 07/11/15  1609 History   First MD Initiated Contact with Patient 07/11/15 1624     Chief Complaint  Patient presents with  . Fall      Patient is a 81 y.o. female presenting with fall. The history is provided by the EMS personnel and the patient. The history is limited by the condition of the patient (Hx dementia).  Fall  Pt was seen at [facility]. Per EMS and pt report: Pt states she was walking with her walker and "got weak." States her "legs gave out" and she fell, hitting her head and face against a doorway. Pt has significant hx of dementia.    Past Medical History  Diagnosis Date  . Hyperlipidemia   . Cancer ([place])   . Breast cancer ([place])   . Dementia   . Chronic neck and back pain   . Cataract   . IBS (irritable bowel syndrome)    Past Surgical History  Procedure Laterality Date  . Mastectomy     Family History  Problem Relation Age of Onset  . Colon cancer      unknown   Social History  Substance Use Topics  . Smoking status: Former Research scientist (life sciences)  . Smokeless tobacco: None  . Alcohol Use: No     Comment: in the past per patient    Review of Systems  Unable to perform ROS: Intubated     Allergies  Aspirin; Chocolate; and Penicillins  Home Medications   Prior to Admission medications   Medication Sig Start Date End Date Taking? Authorizing Provider  donepezil (ARICEPT) 10 MG tablet Take 1 tablet (10 mg total) by mouth at bedtime. 11/16/14   Lucia Gaskins, MD  Multiple Vitamin (MULTIVITAMIN WITH MINERALS) TABS tablet Take 1 tablet by mouth daily.    Historical Provider, MD  omeprazole (PRILOSEC) 20 MG capsule Take 20 mg by mouth daily. 10/20/14   Historical Provider, MD  pravastatin (PRAVACHOL) 40 MG tablet Take 40 mg by mouth daily. 10/20/14   Historical Provider, MD   BP 138/113 mmHg  Pulse 56  Temp(Src) 98 F (36.7 C) (Oral)  Resp 16  Ht 5\' 3"  (1.6 m)  Wt 110 lb (49.896 kg)  BMI 19.49 kg/m2  SpO2 96%   17:41:15  Orthostatic Vital Signs MW  Orthostatic Lying  - BP- Lying: 115/54 mmHg ; Pulse- Lying: 54  Orthostatic Sitting - BP- Sitting: 116/96 mmHg ; Pulse- Sitting: 70  Orthostatic Standing at 0 minutes - BP- Standing at 0 minutes: 80/47 mmHg ; Pulse- Standing at 0 minutes: 88     Filed Vitals:   07/11/15 1612 07/11/15 1630 07/11/15 1913  BP: 142/76 156/64 138/113  Pulse: 66  56  Temp: 98 F (36.7 C)  98 F (36.7 C)  TempSrc: Oral  Oral  Resp: 16  16  Height: 5\' 3"  (1.6 m)    Weight: 110 lb (49.896 kg)    SpO2: 100%  96%     Physical Exam  1725: Physical examination: Vital signs and O2 SAT: Reviewed; Constitutional: Well developed, Well nourished, In no acute distress; Head and Face: Normocephalic, +hematoma right forehead at hairline.; Eyes: EOMI, PERRL, No scleral icterus; ENMT: Mouth and pharynx normal, Left TM normal, Right TM normal, Mucous membranes dry; Neck: Supple, Trachea midline; Spine: No midline CS, TS, LS tenderness.; Cardiovascular: Regular rate and rhythm, No gallop; Respiratory: Breath sounds clear & equal bilaterally, No wheezes, Normal respiratory effort/excursion; Chest: Nontender, No deformity,  Movement normal, No crepitus, No abrasions or ecchymosis.; Abdomen: Soft, Nontender, Nondistended, Normal bowel sounds, No abrasions or ecchymosis.; Genitourinary: No CVA tenderness;; Extremities: No deformity, Full range of motion major/large joints of bilat UE's and LE's without pain or tenderness to palp, Neurovascularly intact, Pulses normal, No tenderness, No edema, Pelvis stable; Neuro: Awake/alert, confused per hx dementia.  Major CN grossly intact. No facial droop. Speech clear. No gross focal motor or sensory deficits in extremities.; Skin: Color normal, Warm, Dry   ED Course  Procedures (including critical care time) Labs Review  Imaging Review  I have personally reviewed and evaluated these images and lab results as part of my medical decision-making.   EKG  Interpretation   Date/Time:  Tuesday July 11 2015 19:14:59 EST Ventricular Rate:  52 PR Interval:  150 QRS Duration: 122 QT Interval:  469 QTC Calculation: 436 R Axis:   87 Text Interpretation:  Sinus rhythm Consider left atrial enlargement Right  bundle branch block Baseline wander Poor data quality No old tracing to  compare Confirmed by [facility]  MD, Nunzio Cory [id number]) on 07/11/2015 8:36:51  PM      MDM   MDM Reviewed: previous chart, nursing note and vitals Interpretation: labs, ECG, x-ray and CT scan     Results for orders placed or performed during the hospital encounter of [id number]  Basic metabolic panel  Result Value Ref Range   Sodium 142 135 - 145 mmol/L   Potassium 4.0 3.5 - 5.1 mmol/L   Chloride 103 101 - 111 mmol/L   CO2 30 22 - 32 mmol/L   Glucose, Bld 82 65 - 99 mg/dL   BUN 19 6 - 20 mg/dL   Creatinine, Ser 0.75 0.44 - 1.00 mg/dL   Calcium 9.5 8.9 - 10.3 mg/dL   GFR calc non Af Amer >60 >60 mL/min   GFR calc Af Amer >60 >60 mL/min   Anion gap 9 5 - 15  Lactic acid, plasma  Result Value Ref Range   Lactic Acid, Venous 1.3 0.5 - 2.0 mmol/L  CBC with Differential  Result Value Ref Range   WBC 4.1 4.0 - 10.5 K/uL   RBC 4.43 3.87 - 5.11 MIL/uL   Hemoglobin 14.1 12.0 - 15.0 g/dL   HCT 41.7 36.0 - 46.0 %   MCV 94.1 78.0 - 100.0 fL   MCH 31.8 26.0 - 34.0 pg   MCHC 33.8 30.0 - 36.0 g/dL   RDW 12.7 11.5 - 15.5 %   Platelets 107 (L) 150 - 400 K/uL   Neutrophils Relative % 52 %   Neutro Abs 2.2 1.7 - 7.7 K/uL   Lymphocytes Relative 31 %   Lymphs Abs 1.3 0.7 - 4.0 K/uL   Monocytes Relative 12 %   Monocytes Absolute 0.5 0.1 - 1.0 K/uL   Eosinophils Relative 4 %   Eosinophils Absolute 0.2 0.0 - 0.7 K/uL   Basophils Relative 1 %   Basophils Absolute 0.0 0.0 - 0.1 K/uL   Smear Review PLATELETS APPEAR DECREASED   Troponin I  Result Value Ref Range   Troponin I 0.26 (H) <0.031 ng/mL   Ct Head Wo Contrast 07/11/2015  CLINICAL DATA:  Recent fall with  head and face trauma, initial encounter EXAM: CT HEAD WITHOUT CONTRAST CT MAXILLOFACIAL WITHOUT CONTRAST CT CERVICAL SPINE WITHOUT CONTRAST TECHNIQUE: Multidetector CT imaging of the head, cervical spine, and maxillofacial structures were performed using the standard protocol without intravenous contrast. Multiplanar CT image reconstructions of the cervical spine and maxillofacial structures  were also generated. COMPARISON:  11/12/2014 FINDINGS: CT HEAD FINDINGS Ventricular dilatation is again identified. No focal mass lesion, hemorrhage or acute infarction is noted. No acute bony abnormality is noted. Mild soft tissue swelling is noted in the left frontal region. CT MAXILLOFACIAL FINDINGS No acute bony abnormality is noted. The paranasal sinuses and mastoid air cells are within normal limits. No gross soft tissue abnormality is seen. CT CERVICAL SPINE FINDINGS Seven cervical segments are well visualized. Vertebral body height is well maintained. Disc space narrowing is noted at all levels from C3-C7. Heavy facet hypertrophic changes and osteophytic changes are noted on the right with resultant neural foraminal narrowing and central canal stenosis. No acute fracture or acute facet abnormality is noted. The surrounding soft tissues are within normal limits. IMPRESSION: CT of the head: Chronic ventricular dilatation. Mild soft tissue swelling in the left frontal region. No acute intracranial abnormality is noted. CT of maxillofacial bones:  No acute fracture noted. CT of the cervical spine: Multilevel degenerative change without acute abnormality. Electronically Signed   By: Inez Catalina M.D.   On: 07/11/2015 18:31   Ct Cervical Spine Wo Contrast 07/11/2015  CLINICAL DATA:  Recent fall with head and face trauma, initial encounter EXAM: CT HEAD WITHOUT CONTRAST CT MAXILLOFACIAL WITHOUT CONTRAST CT CERVICAL SPINE WITHOUT CONTRAST TECHNIQUE: Multidetector CT imaging of the head, cervical spine, and maxillofacial  structures were performed using the standard protocol without intravenous contrast. Multiplanar CT image reconstructions of the cervical spine and maxillofacial structures were also generated. COMPARISON:  11/12/2014 FINDINGS: CT HEAD FINDINGS Ventricular dilatation is again identified. No focal mass lesion, hemorrhage or acute infarction is noted. No acute bony abnormality is noted. Mild soft tissue swelling is noted in the left frontal region. CT MAXILLOFACIAL FINDINGS No acute bony abnormality is noted. The paranasal sinuses and mastoid air cells are within normal limits. No gross soft tissue abnormality is seen. CT CERVICAL SPINE FINDINGS Seven cervical segments are well visualized. Vertebral body height is well maintained. Disc space narrowing is noted at all levels from C3-C7. Heavy facet hypertrophic changes and osteophytic changes are noted on the right with resultant neural foraminal narrowing and central canal stenosis. No acute fracture or acute facet abnormality is noted. The surrounding soft tissues are within normal limits. IMPRESSION: CT of the head: Chronic ventricular dilatation. Mild soft tissue swelling in the left frontal region. No acute intracranial abnormality is noted. CT of maxillofacial bones:  No acute fracture noted. CT of the cervical spine: Multilevel degenerative change without acute abnormality. Electronically Signed   By: Inez Catalina M.D.   On: 07/11/2015 18:31   Ct Maxillofacial Wo Cm 07/11/2015  CLINICAL DATA:  Recent fall with head and face trauma, initial encounter EXAM: CT HEAD WITHOUT CONTRAST CT MAXILLOFACIAL WITHOUT CONTRAST CT CERVICAL SPINE WITHOUT CONTRAST TECHNIQUE: Multidetector CT imaging of the head, cervical spine, and maxillofacial structures were performed using the standard protocol without intravenous contrast. Multiplanar CT image reconstructions of the cervical spine and maxillofacial structures were also generated. COMPARISON:  11/12/2014 FINDINGS: CT HEAD  FINDINGS Ventricular dilatation is again identified. No focal mass lesion, hemorrhage or acute infarction is noted. No acute bony abnormality is noted. Mild soft tissue swelling is noted in the left frontal region. CT MAXILLOFACIAL FINDINGS No acute bony abnormality is noted. The paranasal sinuses and mastoid air cells are within normal limits. No gross soft tissue abnormality is seen. CT CERVICAL SPINE FINDINGS Seven cervical segments are well visualized. Vertebral body height is well maintained.  Disc space narrowing is noted at all levels from C3-C7. Heavy facet hypertrophic changes and osteophytic changes are noted on the right with resultant neural foraminal narrowing and central canal stenosis. No acute fracture or acute facet abnormality is noted. The surrounding soft tissues are within normal limits. IMPRESSION: CT of the head: Chronic ventricular dilatation. Mild soft tissue swelling in the left frontal region. No acute intracranial abnormality is noted. CT of maxillofacial bones:  No acute fracture noted. CT of the cervical spine: Multilevel degenerative change without acute abnormality. Electronically Signed   By: Inez Catalina M.D.   On: 07/11/2015 18:31    2055:  Orthostatic on VS; judicious IVF given. Troponin elevated; pt has allergy to ASA. EKG without acute ST elevation. Pt denies CP.  T/C to [facility]. Laren Everts, case discussed, including:  HPI, pertinent PM/SHx, VS/PE, dx testing, ED course and treatment:  Agreeable to admit, requests to write temporary orders, obtain tele bed to Dr. Denita [facility] service.   Francine Graven, DO 07/13/15 2019

## 2015-07-11 NOTE — ED Notes (Signed)
Legs gave way with her in a doorway.  Hit head.  Hematoma to forehead.

## 2015-07-11 NOTE — ED Notes (Signed)
Dr Laren Everts at bedside,

## 2015-07-11 NOTE — ED Notes (Signed)
Pt pulled iv out, cath intact,

## 2015-07-12 ENCOUNTER — Encounter (HOSPITAL_COMMUNITY): Payer: Self-pay | Admitting: Cardiology

## 2015-07-12 ENCOUNTER — Inpatient Hospital Stay (HOSPITAL_COMMUNITY): Payer: Medicare Other

## 2015-07-12 DIAGNOSIS — W19XXXA Unspecified fall, initial encounter: Secondary | ICD-10-CM

## 2015-07-12 DIAGNOSIS — I4510 Unspecified right bundle-branch block: Secondary | ICD-10-CM | POA: Diagnosis present

## 2015-07-12 DIAGNOSIS — Y92009 Unspecified place in unspecified non-institutional (private) residence as the place of occurrence of the external cause: Secondary | ICD-10-CM

## 2015-07-12 DIAGNOSIS — E785 Hyperlipidemia, unspecified: Secondary | ICD-10-CM | POA: Diagnosis present

## 2015-07-12 DIAGNOSIS — F0390 Unspecified dementia without behavioral disturbance: Secondary | ICD-10-CM | POA: Diagnosis present

## 2015-07-12 DIAGNOSIS — R079 Chest pain, unspecified: Secondary | ICD-10-CM

## 2015-07-12 DIAGNOSIS — R001 Bradycardia, unspecified: Secondary | ICD-10-CM | POA: Diagnosis present

## 2015-07-12 LAB — BASIC METABOLIC PANEL
ANION GAP: 8 (ref 5–15)
BUN: 14 mg/dL (ref 6–20)
CHLORIDE: 107 mmol/L (ref 101–111)
CO2: 27 mmol/L (ref 22–32)
CREATININE: 0.57 mg/dL (ref 0.44–1.00)
Calcium: 8.9 mg/dL (ref 8.9–10.3)
GFR calc non Af Amer: >60 mL/min (ref >60)
Glucose, Bld: 72 mg/dL (ref 65–99)
POTASSIUM: 3.5 mmol/L (ref 3.5–5.1)
SODIUM: 142 mmol/L (ref 135–145)

## 2015-07-12 LAB — CBC
HEMATOCRIT: 37.8 % (ref 36.0–46.0)
Hemoglobin: 12.8 g/dL (ref 12.0–15.0)
MCH: 32 pg (ref 26.0–34.0)
MCHC: 33.9 g/dL (ref 30.0–36.0)
MCV: 94.5 fL (ref 78.0–100.0)
Platelets: 99 10*3/uL — ABNORMAL LOW (ref 150–400)
RBC: 4 MIL/uL (ref 3.87–5.11)
RDW: 13.1 % (ref 11.5–15.5)
WBC: 4.5 10*3/uL (ref 4.0–10.5)

## 2015-07-12 LAB — MRSA PCR SCREENING: MRSA by PCR: NEGATIVE

## 2015-07-12 LAB — TROPONIN I: TROPONIN I: 0.21 ng/mL — AB (ref <0.031)

## 2015-07-12 LAB — VITAMIN B12: VITAMIN B 12: 1417 pg/mL — AB (ref 180–914)

## 2015-07-12 MED ORDER — LORAZEPAM 2 MG/ML IJ SOLN
0.5000 mg | INTRAMUSCULAR | Status: DC | PRN
  Administered 2015-07-12 – 2015-07-13 (×2): 0.5 mg via INTRAVENOUS
  Filled 2015-07-12 (×2): qty 1

## 2015-07-12 NOTE — Consult Note (Signed)
Reason for Consult:   Elevated Troponin  Requesting Physician: Dr Cindie Laroche Primary Cardiologist New  HPI:   81 y/o demented AA female presented to the ED 07/11/15 after a fall at home. The pt's RN is not available and no family members present. The pt is unable to give any history, lethargic, unintelligible speech. No prior cardiac work up in [place] (no old EKGs either). Troponin 0.26- 0.21. EKG shows NSR, SB-52, RBBB.   PMHx:  Past Medical History  Diagnosis Date  . Hyperlipidemia   . Breast cancer ([place])   . Dementia   . Chronic neck and back pain   . Cataract   . IBS (irritable bowel syndrome)   . Dementia     Past Surgical History  Procedure Laterality Date  . Mastectomy      SOCHx:  reports that she has quit smoking. She does not have any smokeless tobacco history on file. She reports that she does not drink alcohol or use illicit drugs.  FAMHx: Family History  Problem Relation Age of Onset  . Colon cancer      unknown    ALLERGIES: Allergies  Allergen Reactions  . Aspirin     REACTION: "Messes up my heart and tells it to stop"  . Chocolate   . Penicillins     REACTION: Pains and feels it messes her up    ROS: Review of Systems: Unobtainable secondary to dementia  HOME MEDICATIONS: Prior to Admission medications   Medication Sig Start Date End Date Taking? Authorizing Provider  donepezil (ARICEPT) 10 MG tablet Take 1 tablet (10 mg total) by mouth at bedtime. 11/16/14   Lucia Gaskins, MD  Multiple Vitamin (MULTIVITAMIN WITH MINERALS) TABS tablet Take 1 tablet by mouth daily.    Historical Provider, MD  omeprazole (PRILOSEC) 20 MG capsule Take 20 mg by mouth daily. 10/20/14   Historical Provider, MD  pravastatin (PRAVACHOL) 40 MG tablet Take 40 mg by mouth daily. 10/20/14   Historical Provider, MD    HOSPITAL MEDICATIONS: I have reviewed the patient's current medications.  VITALS: Blood pressure 138/86, pulse 68, temperature 97.4 F (36.3  C), temperature source Axillary, resp. rate 25, height 5\' 3"  (1.6 m), weight 93 lb 14.7 oz (42.6 kg), SpO2 97 %.  PHYSICAL EXAM: General appearance: cachectic and no distress Neck: no carotid bruit and no JVD Lungs: clear to auscultation bilaterally Heart: regular rate and rhythm Abdomen: soft, non-tender; bowel sounds normal; no masses Extremities: trace edema Pulses: diminnished Skin: cool and dry Neurologic: Lethargic, mumbling speech  LABS: Results for orders placed or performed during the hospital encounter of 07/11/15 (from the past 24 hour(s))  MRSA PCR Screening     Status: None   Collection Time: 07/11/15  1:05 PM  Result Value Ref Range   MRSA by PCR NEGATIVE NEGATIVE  Basic metabolic panel     Status: None   Collection Time: 07/11/15  7:25 PM  Result Value Ref Range   Sodium 142 135 - 145 mmol/L   Potassium 4.0 3.5 - 5.1 mmol/L   Chloride 103 101 - 111 mmol/L   CO2 30 22 - 32 mmol/L   Glucose, Bld 82 65 - 99 mg/dL   BUN 19 6 - 20 mg/dL   Creatinine, Ser 0.75 0.44 - 1.00 mg/dL   Calcium 9.5 8.9 - 10.3 mg/dL   GFR calc non Af Amer >60 >60 mL/min   GFR calc Af Amer >60 >60 mL/min  Anion gap 9 5 - 15  Lactic acid, plasma     Status: None   Collection Time: 07/11/15  7:25 PM  Result Value Ref Range   Lactic Acid, Venous 1.3 0.5 - 2.0 mmol/L  CBC with Differential     Status: Abnormal   Collection Time: 07/11/15  7:25 PM  Result Value Ref Range   WBC 4.1 4.0 - 10.5 K/uL   RBC 4.43 3.87 - 5.11 MIL/uL   Hemoglobin 14.1 12.0 - 15.0 g/dL   HCT 41.7 36.0 - 46.0 %   MCV 94.1 78.0 - 100.0 fL   MCH 31.8 26.0 - 34.0 pg   MCHC 33.8 30.0 - 36.0 g/dL   RDW 12.7 11.5 - 15.5 %   Platelets 107 (L) 150 - 400 K/uL   Neutrophils Relative % 52 %   Neutro Abs 2.2 1.7 - 7.7 K/uL   Lymphocytes Relative 31 %   Lymphs Abs 1.3 0.7 - 4.0 K/uL   Monocytes Relative 12 %   Monocytes Absolute 0.5 0.1 - 1.0 K/uL   Eosinophils Relative 4 %   Eosinophils Absolute 0.2 0.0 - 0.7 K/uL    Basophils Relative 1 %   Basophils Absolute 0.0 0.0 - 0.1 K/uL   Smear Review PLATELETS APPEAR DECREASED   Troponin I     Status: Abnormal   Collection Time: 07/11/15  7:25 PM  Result Value Ref Range   Troponin I 0.26 (H) <0.031 ng/mL  Lactic acid, plasma     Status: None   Collection Time: 07/11/15 10:10 PM  Result Value Ref Range   Lactic Acid, Venous 1.7 0.5 - 2.0 mmol/L  Urinalysis, Routine w reflex microscopic     Status: Abnormal   Collection Time: 07/11/15 10:22 PM  Result Value Ref Range   Color, Urine YELLOW YELLOW   APPearance CLEAR CLEAR   Specific Gravity, Urine >1.030 (H) 1.005 - 1.030   pH 5.5 5.0 - 8.0   Glucose, UA NEGATIVE NEGATIVE mg/dL   Hgb urine dipstick NEGATIVE NEGATIVE   Bilirubin Urine NEGATIVE NEGATIVE   Ketones, ur TRACE (A) NEGATIVE mg/dL   Protein, ur NEGATIVE NEGATIVE mg/dL   Nitrite NEGATIVE NEGATIVE   Leukocytes, UA NEGATIVE NEGATIVE  Troponin I     Status: Abnormal   Collection Time: 07/12/15 12:00 AM  Result Value Ref Range   Troponin I 0.21 (H) <0.031 ng/mL  Basic metabolic panel     Status: None   Collection Time: 07/12/15  4:34 AM  Result Value Ref Range   Sodium 142 135 - 145 mmol/L   Potassium 3.5 3.5 - 5.1 mmol/L   Chloride 107 101 - 111 mmol/L   CO2 27 22 - 32 mmol/L   Glucose, Bld 72 65 - 99 mg/dL   BUN 14 6 - 20 mg/dL   Creatinine, Ser 0.57 0.44 - 1.00 mg/dL   Calcium 8.9 8.9 - 10.3 mg/dL   GFR calc non Af Amer >60 >60 mL/min   GFR calc Af Amer >60 >60 mL/min   Anion gap 8 5 - 15  CBC     Status: Abnormal   Collection Time: 07/12/15  4:34 AM  Result Value Ref Range   WBC 4.5 4.0 - 10.5 K/uL   RBC 4.00 3.87 - 5.11 MIL/uL   Hemoglobin 12.8 12.0 - 15.0 g/dL   HCT 37.8 36.0 - 46.0 %   MCV 94.5 78.0 - 100.0 fL   MCH 32.0 26.0 - 34.0 pg   MCHC 33.9 30.0 -  36.0 g/dL   RDW 13.1 11.5 - 15.5 %   Platelets 99 (L) 150 - 400 K/uL    EKG: NSR, SB, RBBB  IMAGING: Ct Head Wo Contrast  07/11/2015  CLINICAL DATA:  Recent fall with  head and face trauma, initial encounter EXAM: CT HEAD WITHOUT CONTRAST CT MAXILLOFACIAL WITHOUT CONTRAST CT CERVICAL SPINE WITHOUT CONTRAST TECHNIQUE: Multidetector CT imaging of the head, cervical spine, and maxillofacial structures were performed using the standard protocol without intravenous contrast. Multiplanar CT image reconstructions of the cervical spine and maxillofacial structures were also generated. COMPARISON:  11/12/2014 FINDINGS: CT HEAD FINDINGS Ventricular dilatation is again identified. No focal mass lesion, hemorrhage or acute infarction is noted. No acute bony abnormality is noted. Mild soft tissue swelling is noted in the left frontal region. CT MAXILLOFACIAL FINDINGS No acute bony abnormality is noted. The paranasal sinuses and mastoid air cells are within normal limits. No gross soft tissue abnormality is seen. CT CERVICAL SPINE FINDINGS Seven cervical segments are well visualized. Vertebral body height is well maintained. Disc space narrowing is noted at all levels from C3-C7. Heavy facet hypertrophic changes and osteophytic changes are noted on the right with resultant neural foraminal narrowing and central canal stenosis. No acute fracture or acute facet abnormality is noted. The surrounding soft tissues are within normal limits. IMPRESSION: CT of the head: Chronic ventricular dilatation. Mild soft tissue swelling in the left frontal region. No acute intracranial abnormality is noted. CT of maxillofacial bones:  No acute fracture noted. CT of the cervical spine: Multilevel degenerative change without acute abnormality. Electronically Signed   By: Inez Catalina M.D.   On: 07/11/2015 18:31   Ct Cervical Spine Wo Contrast  07/11/2015  CLINICAL DATA:  Recent fall with head and face trauma, initial encounter EXAM: CT HEAD WITHOUT CONTRAST CT MAXILLOFACIAL WITHOUT CONTRAST CT CERVICAL SPINE WITHOUT CONTRAST TECHNIQUE: Multidetector CT imaging of the head, cervical spine, and maxillofacial  structures were performed using the standard protocol without intravenous contrast. Multiplanar CT image reconstructions of the cervical spine and maxillofacial structures were also generated. COMPARISON:  11/12/2014 FINDINGS: CT HEAD FINDINGS Ventricular dilatation is again identified. No focal mass lesion, hemorrhage or acute infarction is noted. No acute bony abnormality is noted. Mild soft tissue swelling is noted in the left frontal region. CT MAXILLOFACIAL FINDINGS No acute bony abnormality is noted. The paranasal sinuses and mastoid air cells are within normal limits. No gross soft tissue abnormality is seen. CT CERVICAL SPINE FINDINGS Seven cervical segments are well visualized. Vertebral body height is well maintained. Disc space narrowing is noted at all levels from C3-C7. Heavy facet hypertrophic changes and osteophytic changes are noted on the right with resultant neural foraminal narrowing and central canal stenosis. No acute fracture or acute facet abnormality is noted. The surrounding soft tissues are within normal limits. IMPRESSION: CT of the head: Chronic ventricular dilatation. Mild soft tissue swelling in the left frontal region. No acute intracranial abnormality is noted. CT of maxillofacial bones:  No acute fracture noted. CT of the cervical spine: Multilevel degenerative change without acute abnormality. Electronically Signed   By: Inez Catalina M.D.   On: 07/11/2015 18:31   Ct Maxillofacial Wo Cm  07/11/2015  CLINICAL DATA:  Recent fall with head and face trauma, initial encounter EXAM: CT HEAD WITHOUT CONTRAST CT MAXILLOFACIAL WITHOUT CONTRAST CT CERVICAL SPINE WITHOUT CONTRAST TECHNIQUE: Multidetector CT imaging of the head, cervical spine, and maxillofacial structures were performed using the standard protocol without intravenous contrast. Multiplanar  CT image reconstructions of the cervical spine and maxillofacial structures were also generated. COMPARISON:  11/12/2014 FINDINGS: CT  HEAD FINDINGS Ventricular dilatation is again identified. No focal mass lesion, hemorrhage or acute infarction is noted. No acute bony abnormality is noted. Mild soft tissue swelling is noted in the left frontal region. CT MAXILLOFACIAL FINDINGS No acute bony abnormality is noted. The paranasal sinuses and mastoid air cells are within normal limits. No gross soft tissue abnormality is seen. CT CERVICAL SPINE FINDINGS Seven cervical segments are well visualized. Vertebral body height is well maintained. Disc space narrowing is noted at all levels from C3-C7. Heavy facet hypertrophic changes and osteophytic changes are noted on the right with resultant neural foraminal narrowing and central canal stenosis. No acute fracture or acute facet abnormality is noted. The surrounding soft tissues are within normal limits. IMPRESSION: CT of the head: Chronic ventricular dilatation. Mild soft tissue swelling in the left frontal region. No acute intracranial abnormality is noted. CT of maxillofacial bones:  No acute fracture noted. CT of the cervical spine: Multilevel degenerative change without acute abnormality. Electronically Signed   By: Inez Catalina M.D.   On: 07/11/2015 18:31    IMPRESSION: Principal Problem:   Fall at home Active Problems:   Elevated troponin   RBBB-no old EKGs   Bradycardia-HR 52 on adm EKG   History of breast cancer   Dementia   Dyslipidemia   RECOMMENDATION: MD to review- echo ordered by primary service, no old EKGs in [facility].  Time Spent Directly with Patient: 30 minutes  Kerin Ransom, [place] beeper 07/12/2015, 12:32 PM   The patient was seen and examined, and I agree with the assessment and plan as documented above, with modifications as noted below. History obtained from chart. Pt with unintelligible speech. No chest pain. Troponins minimally elevated with downward trend. No prior cardiac history. Allergic to ASA. ECG without acute ischemic changes. Will review  echocardiogram ordered by primary service to assess cardiac structure and function.  On statin therapy. Would not pursue an ischemic evaluation (invasive or noninvasive) at this time.   Kate Sable, MD, [facility]  07/12/2015 1:51 PM

## 2015-07-12 NOTE — Progress Notes (Signed)
Called report to Alexandra Aris, RN on dept 300. Verbalized understanding. Pt transferred to floor in safe and stable condition.

## 2015-07-12 NOTE — Progress Notes (Signed)
Patient is status post fall with advanced dementia in assisted living she is persistently elevated troponins 2-D echo is pending for any potential wall motion abnormality she likewise has a hearing deficit Alexandra [name] [id number] DOB: Jun 17, 1926 DOA: 07/11/2015 PCP: Alexandra Curet, MD             Physical Exam: Blood pressure 138/86, pulse 68, temperature 97.4 F (36.3 C), temperature source Axillary, resp. rate 25, height 5\' 3"  (1.6 m), weight 93 lb 14.7 oz (42.6 kg), SpO2 97 %. lungs clear to A&P no rales wheeze rhonchi heart regular rhythm no S3-S4 knee feels rubs abdomen soft nontender bowel sounds normoactive patient was 4 extremities no cranial nerve gross deficits noted   Investigations:  Recent Results (from the past 240 hour(s))  MRSA PCR Screening     Status: None   Collection Time: 07/11/15  1:05 PM  Result Value Ref Range Status   MRSA by PCR NEGATIVE NEGATIVE Final    Comment:        The GeneXpert MRSA Assay (FDA approved for NASAL specimens only), is one component of a comprehensive MRSA colonization surveillance program. It is not intended to diagnose MRSA infection nor to guide or monitor treatment for MRSA infections.      Basic Metabolic Panel:  Recent Labs  07/11/15 1925 07/12/15 0434  NA 142 142  K 4.0 3.5  CL 103 107  CO2 30 27  GLUCOSE 82 72  BUN 19 14  CREATININE 0.75 0.57  CALCIUM 9.5 8.9   Liver Function Tests: No results for input(s): AST, ALT, ALKPHOS, BILITOT, PROT, ALBUMIN in the last 72 hours.   CBC:  Recent Labs  07/11/15 1925 07/12/15 0434  WBC 4.1 4.5  NEUTROABS 2.2  --   HGB 14.1 12.8  HCT 41.7 37.8  MCV 94.1 94.5  PLT 107* 99*    Ct Head Wo Contrast  07/11/2015  CLINICAL DATA:  Recent fall with head and face trauma, initial encounter EXAM: CT HEAD WITHOUT CONTRAST CT MAXILLOFACIAL WITHOUT CONTRAST CT CERVICAL SPINE WITHOUT CONTRAST TECHNIQUE: Multidetector CT imaging of the head, cervical spine, and  maxillofacial structures were performed using the standard protocol without intravenous contrast. Multiplanar CT image reconstructions of the cervical spine and maxillofacial structures were also generated. COMPARISON:  11/12/2014 FINDINGS: CT HEAD FINDINGS Ventricular dilatation is again identified. No focal mass lesion, hemorrhage or acute infarction is noted. No acute bony abnormality is noted. Mild soft tissue swelling is noted in the left frontal region. CT MAXILLOFACIAL FINDINGS No acute bony abnormality is noted. The paranasal sinuses and mastoid air cells are within normal limits. No gross soft tissue abnormality is seen. CT CERVICAL SPINE FINDINGS Seven cervical segments are well visualized. Vertebral body height is well maintained. Disc space narrowing is noted at all levels from C3-C7. Heavy facet hypertrophic changes and osteophytic changes are noted on the right with resultant neural foraminal narrowing and central canal stenosis. No acute fracture or acute facet abnormality is noted. The surrounding soft tissues are within normal limits. IMPRESSION: CT of the head: Chronic ventricular dilatation. Mild soft tissue swelling in the left frontal region. No acute intracranial abnormality is noted. CT of maxillofacial bones:  No acute fracture noted. CT of the cervical spine: Multilevel degenerative change without acute abnormality. Electronically Signed   By: Inez Catalina M.D.   On: 07/11/2015 18:31   Ct Cervical Spine Wo Contrast  07/11/2015  CLINICAL DATA:  Recent fall with head and face trauma, initial encounter EXAM: CT  HEAD WITHOUT CONTRAST CT MAXILLOFACIAL WITHOUT CONTRAST CT CERVICAL SPINE WITHOUT CONTRAST TECHNIQUE: Multidetector CT imaging of the head, cervical spine, and maxillofacial structures were performed using the standard protocol without intravenous contrast. Multiplanar CT image reconstructions of the cervical spine and maxillofacial structures were also generated. COMPARISON:   11/12/2014 FINDINGS: CT HEAD FINDINGS Ventricular dilatation is again identified. No focal mass lesion, hemorrhage or acute infarction is noted. No acute bony abnormality is noted. Mild soft tissue swelling is noted in the left frontal region. CT MAXILLOFACIAL FINDINGS No acute bony abnormality is noted. The paranasal sinuses and mastoid air cells are within normal limits. No gross soft tissue abnormality is seen. CT CERVICAL SPINE FINDINGS Seven cervical segments are well visualized. Vertebral body height is well maintained. Disc space narrowing is noted at all levels from C3-C7. Heavy facet hypertrophic changes and osteophytic changes are noted on the right with resultant neural foraminal narrowing and central canal stenosis. No acute fracture or acute facet abnormality is noted. The surrounding soft tissues are within normal limits. IMPRESSION: CT of the head: Chronic ventricular dilatation. Mild soft tissue swelling in the left frontal region. No acute intracranial abnormality is noted. CT of maxillofacial bones:  No acute fracture noted. CT of the cervical spine: Multilevel degenerative change without acute abnormality. Electronically Signed   By: Inez Catalina M.D.   On: 07/11/2015 18:31   Ct Maxillofacial Wo Cm  07/11/2015  CLINICAL DATA:  Recent fall with head and face trauma, initial encounter EXAM: CT HEAD WITHOUT CONTRAST CT MAXILLOFACIAL WITHOUT CONTRAST CT CERVICAL SPINE WITHOUT CONTRAST TECHNIQUE: Multidetector CT imaging of the head, cervical spine, and maxillofacial structures were performed using the standard protocol without intravenous contrast. Multiplanar CT image reconstructions of the cervical spine and maxillofacial structures were also generated. COMPARISON:  11/12/2014 FINDINGS: CT HEAD FINDINGS Ventricular dilatation is again identified. No focal mass lesion, hemorrhage or acute infarction is noted. No acute bony abnormality is noted. Mild soft tissue swelling is noted in the left  frontal region. CT MAXILLOFACIAL FINDINGS No acute bony abnormality is noted. The paranasal sinuses and mastoid air cells are within normal limits. No gross soft tissue abnormality is seen. CT CERVICAL SPINE FINDINGS Seven cervical segments are well visualized. Vertebral body height is well maintained. Disc space narrowing is noted at all levels from C3-C7. Heavy facet hypertrophic changes and osteophytic changes are noted on the right with resultant neural foraminal narrowing and central canal stenosis. No acute fracture or acute facet abnormality is noted. The surrounding soft tissues are within normal limits. IMPRESSION: CT of the head: Chronic ventricular dilatation. Mild soft tissue swelling in the left frontal region. No acute intracranial abnormality is noted. CT of maxillofacial bones:  No acute fracture noted. CT of the cervical spine: Multilevel degenerative change without acute abnormality. Electronically Signed   By: Inez Catalina M.D.   On: 07/11/2015 18:31      Medications:   Impression: Advanced dementia Hearing deficit  Active Problems:   Elevated troponin   NSTEMI (non-ST elevated myocardial infarction) ([place])     Plan: 2-D echo for any regional wall motion abnormality dementia panel ordered cardiology consult requested due to 2 elevated troponins  Consultants: Cardiology requested    Procedures   Antibiotics:                   Code Status: No code DO NOT RESUSCITATE  Family Communication:    Disposition Plan see plan above  Time spent: 30 minutes   LOS:  1 day   Dayra Rapley M   07/12/2015, 12:04 PM

## 2015-07-13 LAB — CBC
HCT: 40.8 % (ref 36.0–46.0)
HEMOGLOBIN: 14.3 g/dL (ref 12.0–15.0)
MCH: 31.9 pg (ref 26.0–34.0)
MCHC: 35 g/dL (ref 30.0–36.0)
MCV: 91.1 fL (ref 78.0–100.0)
PLATELETS: 101 10*3/uL — AB (ref 150–400)
RBC: 4.48 MIL/uL (ref 3.87–5.11)
RDW: 12.7 % (ref 11.5–15.5)
WBC: 3.7 10*3/uL — ABNORMAL LOW (ref 4.0–10.5)

## 2015-07-13 LAB — VITAMIN B12: VITAMIN B 12: 1647 pg/mL — AB (ref 180–914)

## 2015-07-13 LAB — TSH: TSH: 1.22 u[IU]/mL (ref 0.350–4.500)

## 2015-07-13 LAB — FOLATE: FOLATE: 70.2 ng/mL (ref >5.9)

## 2015-07-13 LAB — SEDIMENTATION RATE: SED RATE: 8 mm/h (ref 0–22)

## 2015-07-13 NOTE — Progress Notes (Signed)
Patient had a fall she has progressive advanced dementia B-12 deficient the B-12 levels are within normal limits dementia workup pending which was unremarkable in several months ago in office had physical therapy and consider for skilled nursing facility placement Alexandra [name] [id number] DOB: 1925/09/13 DOA: 07/11/2015 PCP: Maricela Curet, MD             Physical Exam: Blood pressure 144/55, pulse 65, temperature 98.3 F (36.8 C), temperature source Oral, resp. rate 19, height 5\' 3"  (1.6 m), weight 93 lb 14.7 oz (42.6 kg), SpO2 97 %. lungs clear to A&P no rales wheeze rhonchi heart regular rhythm no S3-S4 no heaves feels rubs abdomen soft nontender bowel sounds normoactive   Investigations:  Recent Results (from the past 240 hour(s))  MRSA PCR Screening     Status: None   Collection Time: 07/11/15  1:05 PM  Result Value Ref Range Status   MRSA by PCR NEGATIVE NEGATIVE Final    Comment:        The GeneXpert MRSA Assay (FDA approved for NASAL specimens only), is one component of a comprehensive MRSA colonization surveillance program. It is not intended to diagnose MRSA infection nor to guide or monitor treatment for MRSA infections.      Basic Metabolic Panel:  Recent Labs  07/11/15 1925 07/12/15 0434  NA 142 142  K 4.0 3.5  CL 103 107  CO2 30 27  GLUCOSE 82 72  BUN 19 14  CREATININE 0.75 0.57  CALCIUM 9.5 8.9   Liver Function Tests: No results for input(s): AST, ALT, ALKPHOS, BILITOT, PROT, ALBUMIN in the last 72 hours.   CBC:  Recent Labs  07/11/15 1925 07/12/15 0434  WBC 4.1 4.5  NEUTROABS 2.2  --   HGB 14.1 12.8  HCT 41.7 37.8  MCV 94.1 94.5  PLT 107* 99*    Ct Head Wo Contrast  07/11/2015  CLINICAL DATA:  Recent fall with head and face trauma, initial encounter EXAM: CT HEAD WITHOUT CONTRAST CT MAXILLOFACIAL WITHOUT CONTRAST CT CERVICAL SPINE WITHOUT CONTRAST TECHNIQUE: Multidetector CT imaging of the head, cervical spine, and  maxillofacial structures were performed using the standard protocol without intravenous contrast. Multiplanar CT image reconstructions of the cervical spine and maxillofacial structures were also generated. COMPARISON:  11/12/2014 FINDINGS: CT HEAD FINDINGS Ventricular dilatation is again identified. No focal mass lesion, hemorrhage or acute infarction is noted. No acute bony abnormality is noted. Mild soft tissue swelling is noted in the left frontal region. CT MAXILLOFACIAL FINDINGS No acute bony abnormality is noted. The paranasal sinuses and mastoid air cells are within normal limits. No gross soft tissue abnormality is seen. CT CERVICAL SPINE FINDINGS Seven cervical segments are well visualized. Vertebral body height is well maintained. Disc space narrowing is noted at all levels from C3-C7. Heavy facet hypertrophic changes and osteophytic changes are noted on the right with resultant neural foraminal narrowing and central canal stenosis. No acute fracture or acute facet abnormality is noted. The surrounding soft tissues are within normal limits. IMPRESSION: CT of the head: Chronic ventricular dilatation. Mild soft tissue swelling in the left frontal region. No acute intracranial abnormality is noted. CT of maxillofacial bones:  No acute fracture noted. CT of the cervical spine: Multilevel degenerative change without acute abnormality. Electronically Signed   By: Inez Catalina M.D.   On: 07/11/2015 18:31   Ct Cervical Spine Wo Contrast  07/11/2015  CLINICAL DATA:  Recent fall with head and face trauma, initial encounter EXAM: CT HEAD  WITHOUT CONTRAST CT MAXILLOFACIAL WITHOUT CONTRAST CT CERVICAL SPINE WITHOUT CONTRAST TECHNIQUE: Multidetector CT imaging of the head, cervical spine, and maxillofacial structures were performed using the standard protocol without intravenous contrast. Multiplanar CT image reconstructions of the cervical spine and maxillofacial structures were also generated. COMPARISON:   11/12/2014 FINDINGS: CT HEAD FINDINGS Ventricular dilatation is again identified. No focal mass lesion, hemorrhage or acute infarction is noted. No acute bony abnormality is noted. Mild soft tissue swelling is noted in the left frontal region. CT MAXILLOFACIAL FINDINGS No acute bony abnormality is noted. The paranasal sinuses and mastoid air cells are within normal limits. No gross soft tissue abnormality is seen. CT CERVICAL SPINE FINDINGS Seven cervical segments are well visualized. Vertebral body height is well maintained. Disc space narrowing is noted at all levels from C3-C7. Heavy facet hypertrophic changes and osteophytic changes are noted on the right with resultant neural foraminal narrowing and central canal stenosis. No acute fracture or acute facet abnormality is noted. The surrounding soft tissues are within normal limits. IMPRESSION: CT of the head: Chronic ventricular dilatation. Mild soft tissue swelling in the left frontal region. No acute intracranial abnormality is noted. CT of maxillofacial bones:  No acute fracture noted. CT of the cervical spine: Multilevel degenerative change without acute abnormality. Electronically Signed   By: Inez Catalina M.D.   On: 07/11/2015 18:31   Ct Maxillofacial Wo Cm  07/11/2015  CLINICAL DATA:  Recent fall with head and face trauma, initial encounter EXAM: CT HEAD WITHOUT CONTRAST CT MAXILLOFACIAL WITHOUT CONTRAST CT CERVICAL SPINE WITHOUT CONTRAST TECHNIQUE: Multidetector CT imaging of the head, cervical spine, and maxillofacial structures were performed using the standard protocol without intravenous contrast. Multiplanar CT image reconstructions of the cervical spine and maxillofacial structures were also generated. COMPARISON:  11/12/2014 FINDINGS: CT HEAD FINDINGS Ventricular dilatation is again identified. No focal mass lesion, hemorrhage or acute infarction is noted. No acute bony abnormality is noted. Mild soft tissue swelling is noted in the left  frontal region. CT MAXILLOFACIAL FINDINGS No acute bony abnormality is noted. The paranasal sinuses and mastoid air cells are within normal limits. No gross soft tissue abnormality is seen. CT CERVICAL SPINE FINDINGS Seven cervical segments are well visualized. Vertebral body height is well maintained. Disc space narrowing is noted at all levels from C3-C7. Heavy facet hypertrophic changes and osteophytic changes are noted on the right with resultant neural foraminal narrowing and central canal stenosis. No acute fracture or acute facet abnormality is noted. The surrounding soft tissues are within normal limits. IMPRESSION: CT of the head: Chronic ventricular dilatation. Mild soft tissue swelling in the left frontal region. No acute intracranial abnormality is noted. CT of maxillofacial bones:  No acute fracture noted. CT of the cervical spine: Multilevel degenerative change without acute abnormality. Electronically Signed   By: Inez Catalina M.D.   On: 07/11/2015 18:31      Medications:   Impression:  Principal Problem:   Fall at home Active Problems:   History of breast cancer   Elevated troponin   Dementia   Dyslipidemia   RBBB-no old EKGs   Bradycardia-HR 52 on adm EKG     Plan: Physical therapy and evaluation for skilled nursing facility placement  Consultants:    Procedures   Antibiotics:                   Code Status:   Family Communication:    Disposition Plan see plan above  Time spent: 30 minutes  LOS: 2 days   Janan Bogie M   07/13/2015, 6:57 AM

## 2015-07-13 NOTE — Progress Notes (Signed)
Patient is Q4 neuro checks, but she has dementia and does not follow commands. Pupil assessment has been WNL. Dr.Gosrani gave permission to discontinue neuro checks. Will continue to monitor.

## 2015-07-13 NOTE — Care Management Note (Signed)
Case Management Note  Patient Details  Name: Alexandra [name] MRN: [id number] Date of Birth: [birth year]  Subjective/Objective:                  Pt admitted from home with NSTEMI from [facility]. Pt will return to facility at discharge.  Action/Plan: PT does not recommend any SNF. CSW is aware and will arrange discharge back to facility when medically stable.  Expected Discharge Date:                  Expected Discharge Plan:  Assisted Living / Rest Home  In-House Referral:  Clinical Social Work  Discharge planning Services  CM Consult  Post Acute Care Choice:  NA Choice offered to:  NA  DME Arranged:    DME Agency:     HH Arranged:    [place] Agency:     Status of Service:  Completed, signed off  Medicare Important Message Given:    Date Medicare IM Given:    Medicare IM give by:    Date Additional Medicare IM Given:    Additional Medicare Important Message give by:     If discussed at [place] of Stay Meetings, dates discussed:    Additional Comments:  Joylene Draft, RN 07/13/2015, 1:40 PM

## 2015-07-13 NOTE — Progress Notes (Signed)
PT Cancellation Note  Patient Details Name: Alexandra [name] MRN: [id number] DOB: 08/06/1925   Cancelled Treatment:    Reason Eval/Treat Not Completed: Other (comment).  Pt is lying in bed babbling to herself.   Will not open her eyes on request nor acknowledge my presence.  Her head is held rigidly in extension. She appears to be in advanced stage of dementia and is not appropriate for PT.  She would be appropriate for long term nursing home care.   Demetrios Isaacs L  PT 07/13/2015, 9:01 AM [phone]

## 2015-07-13 NOTE — Clinical Social Work Note (Signed)
Clinical Social Work Assessment  Patient Details  Name: Alexandra [name] MRN: [id number] Date of Birth: 1926/06/15  Date of referral:  07/13/15               Reason for consult:  Facility Placement                Permission sought to share information with:    Permission granted to share information::     Name::        Agency::     Relationship::     Contact Information:     Housing/Transportation Living arrangements for the past 2 months:  [place] of Information:  Adult Children, Facility Patient Interpreter Needed:  None Criminal Activity/Legal Involvement Pertinent to Current Situation/Hospitalization:  No - Comment as needed Significant Relationships:  Adult Children Lives with:  Facility Resident Do you feel safe going back to the place where you live?  Yes Need for family participation in patient care:  Yes (Comment)  Care giving concerns:  None identified, facility resident.    Social Worker assessment / plan:  CSW spoke with patient's son, Carolette Delrio.  Mr. Branda advised that he spoke with his mother on Sunday on the telephone.  He stated that his mother has been a resident at [facility] for "years." He stated that she ambulates with a walker from "time to time" but is able to ambulate independently.  Mr. Tarolli stated that his mother is pretty independent.  He stated that he lives in [place] currently and does not have transportation.  He stated that she was supposed to come and see her on Monday, but his transportation fell through.  He stated that he had previously been residing in [place], however he has recently moved back to [place], which makes visitation more difficult.  He reported that he speaks to patient via phone very frequently. Mr. Ririe advised that he wanted patient to return to [facility] at discharge. CSW spoke with Napoleon Form at [facility]. He confirmed Mr. Wactor statements.  He advised that patient could return to the facility at  discharge.    Employment status:  Retired Nurse, adult PT Recommendations:  Not assessed at this time Information / Referral to community resources:     Patient/Family's Response to care:  Family is agreeable for patient to return to facility at discharge.   Patient/Family's Understanding of and Emotional Response to Diagnosis, Current Treatment, and Prognosis:  Family understands patient diagnosis, treatment and prognosis.  Emotional Assessment Appearance:  Appears stated age Attitude/Demeanor/Rapport:  Unable to Assess Affect (typically observed):  Unable to Assess Orientation:  Oriented to Self Alcohol / Substance use:  Not Applicable Psych involvement (Current and /or in the community):  No (Comment)  Discharge Needs  Concerns to be addressed:  No discharge needs identified, Lack of Support Readmission within the last 30 days:  No Current discharge risk:  None Barriers to Discharge:  No Barriers Identified   Ihor Gully, LCSW 07/13/2015, 1:44 PM [phone]

## 2015-07-13 NOTE — NC FL2 (Signed)
[place] MEDICAID FL2 LEVEL OF CARE SCREENING TOOL     IDENTIFICATION  Patient Name: Alexandra [name] Birthdate: 1925-10-20 Sex: female Admission Date (Current Location): 07/11/2015  [facility] and [place] Number:  [employer] and Address:  [facility] [address] [place]      Provider Number: [id number]  Attending Physician Name and Address:  Lucia Gaskins, MD  Relative Name and Phone Number:   Orlean, D[place], [phone])    Current Level of Care: Hospital Recommended Level of Care: [place] Prior Approval Number:    Date Approved/Denied:   PASRR Number:    Discharge Plan: Other (Comment) ([place])    Current Diagnoses: Patient Active Problem List   Diagnosis Date Noted  . Fall at home 07/12/2015  . Dementia 07/12/2015  . Dyslipidemia 07/12/2015  . RBBB-no old EKGs 07/12/2015  . Bradycardia-HR 52 on adm EKG 07/12/2015  . Elevated troponin 07/11/2015  . NSTEMI (non-ST elevated myocardial infarction) ([place]) 07/11/2015  . Elevated LFTs   . Altered mental status 11/12/2014  . Gastroenteritis 11/12/2014  . Fever 11/12/2014  . LEUKOPENIA, MILD 04/04/2009  . TRANSAMINASES, SERUM, ELEVATED 04/04/2009  . GRIEF REACTION 09/06/2008  . DECREASED HEARING 02/11/2008  . SPINAL STENOSIS 02/18/2007  . History of breast cancer 01/15/2007  . ANXIETY 01/15/2007  . PERIPHERAL NEUROPATHY 01/15/2007  . CATARACT NOS 01/15/2007  . ALLERGIC RHINITIS 01/15/2007  . ASTHMA 01/15/2007  . GERD 01/15/2007  . CONSTIPATION 01/15/2007  . IBS 01/15/2007  . ARTHRITIS 01/15/2007    Orientation RESPIRATION BLADDER Height & Weight    Self  Normal Continent 5\' 3"  (160 cm) 93 lbs.  BEHAVIORAL SYMPTOMS/MOOD NEUROLOGICAL BOWEL NUTRITION STATUS      Continent Diet (Diet Soft)  AMBULATORY STATUS COMMUNICATION OF NEEDS Skin   Limited Assist Verbally Normal                       Personal Care Assistance Level of  Assistance  Bathing, Dressing Bathing Assistance: Limited assistance   Dressing Assistance: Limited assistance     Functional Limitations Info             SPECIAL CARE FACTORS FREQUENCY                       Contractures      Additional Factors Info  Allergies, Psychotropic   Allergies Info: Aspirin, Chocolate, Penicillins (Aspirin, Chocolate, Penicillins) Psychotropic Info: Ativan (Ativan)         Current Medications (07/13/2015):  This is the current hospital active medication list Current Facility-Administered Medications  Medication Dose Route Frequency Provider Last Rate Last Dose  . 0.9 %  sodium chloride infusion   Intravenous Continuous Francine Graven, DO 75 mL/hr at 07/13/15 [id number]    . acetaminophen (TYLENOL) tablet 650 mg  650 mg Oral Q6H PRN Merton Border, MD       Or  . acetaminophen (TYLENOL) suppository 650 mg  650 mg Rectal Q6H PRN Merton Border, MD      . enoxaparin (LOVENOX) injection 30 mg  30 mg Subcutaneous Q24H Merton Border, MD   30 mg at 07/13/15 1013  . HYDROcodone-acetaminophen (NORCO/VICODIN) 5-325 MG per tablet 1-2 tablet  1-2 tablet Oral Q4H PRN Merton Border, MD      . LORazepam (ATIVAN) injection 0.5 mg  0.5 mg Intravenous Q4H PRN Merton Border, MD   0.5 mg at 07/13/15 0516  .  multivitamin with minerals tablet 1 tablet  1 tablet Oral Daily Merton Border, MD   1 tablet at 07/13/15 1013  . ondansetron (ZOFRAN) tablet 4 mg  4 mg Oral Q6H PRN Merton Border, MD       Or  . ondansetron (ZOFRAN) injection 4 mg  4 mg Intravenous Q6H PRN Merton Border, MD      . pantoprazole (PROTONIX) EC tablet 40 mg  40 mg Oral Daily Merton Border, MD   40 mg at 07/13/15 1013  . pravastatin (PRAVACHOL) tablet 40 mg  40 mg Oral Daily Merton Border, MD   40 mg at 07/13/15 1013  . sodium chloride 0.9 % injection 3 mL  3 mL Intravenous Q12H Merton Border, MD   3 mL at 07/11/15 2315     Discharge Medications: Please see discharge summary for a list of discharge medications.  Relevant  Imaging Results:  Relevant Lab Results:   Additional Information    Demarquez Ciolek, Clydene Pugh, LCSW

## 2015-07-14 LAB — GLUCOSE, CAPILLARY: GLUCOSE-CAPILLARY: 61 mg/dL — AB (ref 65–99)

## 2015-07-14 LAB — RPR: RPR Ser Ql: NONREACTIVE

## 2015-07-14 NOTE — Clinical Social Work Note (Signed)
CSW spoke with patient's son, Yaniah Norfleet, and advised that patient was discharging and would return to the facility today.  CSW contacted [facility] and spoke with Letta Median. CSW advised that patient was ready for discharge.   CSW sent clinicals via [employer].  CSW signing off.   Ihor Gully, [place] [phone]

## 2015-07-14 NOTE — Discharge Summary (Signed)
Physician Discharge Summary  Alexandra [name] [id number] DOB: Sep 07, 1925 DOA: 07/11/2015  PCP: Maricela Curet, MD  Admit date: 07/11/2015 Discharge date: 07/14/2015   Recommendations for Outpatient Follow-up:  Assisted living facility to resume all previous medicines and follow-up my office one week's time to check head trauma hematoma remove any sutures if necessary she likewise is instructed to take aspirin 81 mg by mouth daily she is not already on this is not clear from the chart as I read Discharge Diagnoses:  Principal Problem:   Fall at home Active Problems:   History of breast cancer   Elevated troponin   Dementia   Dyslipidemia   RBBB-no old EKGs   Bradycardia-HR 52 on adm EKG   Discharge Condition: Good  Filed Weights   07/11/15 1612 07/11/15 2303 07/12/15 0400  Weight: 110 lb (49.896 kg) 92 lb 13 oz (42.1 kg) 93 lb 14.7 oz (42.6 kg)    History of present illness:  She is an elderly black female 71 with advanced dementia apparently sustained a fall and head hematoma was admitted for sources of infection were essentially negative could have elevated troponins which were serial and cardiology was consult 2-D echocardiogram reviewed showed normal systolic function no regional wall motion abnormalities consistent with any ischemic cardiomyopathy since been asymptomatic from chest pain standpoint has had lipids controlled with statins and is likewise on baby aspirin if we discharged for follow-up in my office in one week's time to assess hematoma or head in any ischemic related symptoms of May consider  Hospital Course:  See history of present illness  Procedures:    Consultations:  Cardiology  Discharge Instructions  Discharge Instructions    Discharge instructions    Complete by:  As directed      Discharge patient    Complete by:  As directed             Medication List    STOP taking these medications        acetaminophen 650 MG CR tablet    Commonly known as:  TYLENOL     cetirizine 10 MG tablet  Commonly known as:  ZYRTEC      TAKE these medications        donepezil 10 MG tablet  Commonly known as:  ARICEPT  Take 1 tablet (10 mg total) by mouth at bedtime.     magnesium hydroxide 400 MG/5ML suspension  Commonly known as:  MILK OF MAGNESIA  Take 30 mLs by mouth daily as needed for mild constipation.     multivitamin with minerals Tabs tablet  Take 1 tablet by mouth daily.     omeprazole 20 MG capsule  Commonly known as:  PRILOSEC  Take 20 mg by mouth daily.     pravastatin 40 MG tablet  Commonly known as:  PRAVACHOL  Take 40 mg by mouth daily.       Allergies  Allergen Reactions  . Aspirin     REACTION: "Messes up my heart and tells it to stop"  . Chocolate   . Penicillins     REACTION: Pains and feels it messes her up      The results of significant diagnostics from this hospitalization (including imaging, microbiology, ancillary and laboratory) are listed below for reference.    Significant Diagnostic Studies: Ct Head Wo Contrast  07/11/2015  CLINICAL DATA:  Recent fall with head and face trauma, initial encounter EXAM: CT HEAD WITHOUT CONTRAST CT MAXILLOFACIAL WITHOUT CONTRAST CT CERVICAL  SPINE WITHOUT CONTRAST TECHNIQUE: Multidetector CT imaging of the head, cervical spine, and maxillofacial structures were performed using the standard protocol without intravenous contrast. Multiplanar CT image reconstructions of the cervical spine and maxillofacial structures were also generated. COMPARISON:  11/12/2014 FINDINGS: CT HEAD FINDINGS Ventricular dilatation is again identified. No focal mass lesion, hemorrhage or acute infarction is noted. No acute bony abnormality is noted. Mild soft tissue swelling is noted in the left frontal region. CT MAXILLOFACIAL FINDINGS No acute bony abnormality is noted. The paranasal sinuses and mastoid air cells are within normal limits. No gross soft tissue abnormality is  seen. CT CERVICAL SPINE FINDINGS Seven cervical segments are well visualized. Vertebral body height is well maintained. Disc space narrowing is noted at all levels from C3-C7. Heavy facet hypertrophic changes and osteophytic changes are noted on the right with resultant neural foraminal narrowing and central canal stenosis. No acute fracture or acute facet abnormality is noted. The surrounding soft tissues are within normal limits. IMPRESSION: CT of the head: Chronic ventricular dilatation. Mild soft tissue swelling in the left frontal region. No acute intracranial abnormality is noted. CT of maxillofacial bones:  No acute fracture noted. CT of the cervical spine: Multilevel degenerative change without acute abnormality. Electronically Signed   By: Inez Catalina M.D.   On: 07/11/2015 18:31   Ct Cervical Spine Wo Contrast  07/11/2015  CLINICAL DATA:  Recent fall with head and face trauma, initial encounter EXAM: CT HEAD WITHOUT CONTRAST CT MAXILLOFACIAL WITHOUT CONTRAST CT CERVICAL SPINE WITHOUT CONTRAST TECHNIQUE: Multidetector CT imaging of the head, cervical spine, and maxillofacial structures were performed using the standard protocol without intravenous contrast. Multiplanar CT image reconstructions of the cervical spine and maxillofacial structures were also generated. COMPARISON:  11/12/2014 FINDINGS: CT HEAD FINDINGS Ventricular dilatation is again identified. No focal mass lesion, hemorrhage or acute infarction is noted. No acute bony abnormality is noted. Mild soft tissue swelling is noted in the left frontal region. CT MAXILLOFACIAL FINDINGS No acute bony abnormality is noted. The paranasal sinuses and mastoid air cells are within normal limits. No gross soft tissue abnormality is seen. CT CERVICAL SPINE FINDINGS Seven cervical segments are well visualized. Vertebral body height is well maintained. Disc space narrowing is noted at all levels from C3-C7. Heavy facet hypertrophic changes and osteophytic  changes are noted on the right with resultant neural foraminal narrowing and central canal stenosis. No acute fracture or acute facet abnormality is noted. The surrounding soft tissues are within normal limits. IMPRESSION: CT of the head: Chronic ventricular dilatation. Mild soft tissue swelling in the left frontal region. No acute intracranial abnormality is noted. CT of maxillofacial bones:  No acute fracture noted. CT of the cervical spine: Multilevel degenerative change without acute abnormality. Electronically Signed   By: Inez Catalina M.D.   On: 07/11/2015 18:31   Ct Maxillofacial Wo Cm  07/11/2015  CLINICAL DATA:  Recent fall with head and face trauma, initial encounter EXAM: CT HEAD WITHOUT CONTRAST CT MAXILLOFACIAL WITHOUT CONTRAST CT CERVICAL SPINE WITHOUT CONTRAST TECHNIQUE: Multidetector CT imaging of the head, cervical spine, and maxillofacial structures were performed using the standard protocol without intravenous contrast. Multiplanar CT image reconstructions of the cervical spine and maxillofacial structures were also generated. COMPARISON:  11/12/2014 FINDINGS: CT HEAD FINDINGS Ventricular dilatation is again identified. No focal mass lesion, hemorrhage or acute infarction is noted. No acute bony abnormality is noted. Mild soft tissue swelling is noted in the left frontal region. CT MAXILLOFACIAL FINDINGS No acute bony  abnormality is noted. The paranasal sinuses and mastoid air cells are within normal limits. No gross soft tissue abnormality is seen. CT CERVICAL SPINE FINDINGS Seven cervical segments are well visualized. Vertebral body height is well maintained. Disc space narrowing is noted at all levels from C3-C7. Heavy facet hypertrophic changes and osteophytic changes are noted on the right with resultant neural foraminal narrowing and central canal stenosis. No acute fracture or acute facet abnormality is noted. The surrounding soft tissues are within normal limits. IMPRESSION: CT of the  head: Chronic ventricular dilatation. Mild soft tissue swelling in the left frontal region. No acute intracranial abnormality is noted. CT of maxillofacial bones:  No acute fracture noted. CT of the cervical spine: Multilevel degenerative change without acute abnormality. Electronically Signed   By: Inez Catalina M.D.   On: 07/11/2015 18:31    Microbiology: Recent Results (from the past 240 hour(s))  MRSA PCR Screening     Status: None   Collection Time: 07/11/15  1:05 PM  Result Value Ref Range Status   MRSA by PCR NEGATIVE NEGATIVE Final    Comment:        The GeneXpert MRSA Assay (FDA approved for NASAL specimens only), is one component of a comprehensive MRSA colonization surveillance program. It is not intended to diagnose MRSA infection nor to guide or monitor treatment for MRSA infections.      Labs: Basic Metabolic Panel:  Recent Labs Lab 07/11/15 1925 07/12/15 0434  NA 142 142  K 4.0 3.5  CL 103 107  CO2 30 27  GLUCOSE 82 72  BUN 19 14  CREATININE 0.75 0.57  CALCIUM 9.5 8.9   Liver Function Tests: No results for input(s): AST, ALT, ALKPHOS, BILITOT, PROT, ALBUMIN in the last 168 hours. No results for input(s): LIPASE, AMYLASE in the last 168 hours. No results for input(s): AMMONIA in the last 168 hours. CBC:  Recent Labs Lab 07/11/15 1925 07/12/15 0434 07/13/15 0815  WBC 4.1 4.5 3.7*  NEUTROABS 2.2  --   --   HGB 14.1 12.8 14.3  HCT 41.7 37.8 40.8  MCV 94.1 94.5 91.1  PLT 107* 99* 101*   Cardiac Enzymes:  Recent Labs Lab 07/11/15 1925 07/12/15  TROPONINI 0.26* 0.21*   BNP: BNP (last 3 results) No results for input(s): BNP in the last 8760 hours.  ProBNP (last 3 results) No results for input(s): PROBNP in the last 8760 hours.  CBG:  Recent Labs Lab 07/14/15 1118  GLUCAP 61*       Signed:  Nero Sawatzky M  [facility] Pager: [phone] 07/14/2015, 12:29 PM

## 2015-07-14 NOTE — Progress Notes (Signed)
Patient care giver was given discharge instructions, prescription, and care notes.  Verbalized understanding via teach back.  IV was removed and the site was WNL. Patient voiced no further complaints or concerns at the time of discharge.    Patient left the floor via w/c with staff and family in stable condition.  Patient was dressed and centralized telementry was notified prior to d/c.  Tele box returned to the book.

## 2015-07-14 NOTE — NC FL2 (Signed)
[place] MEDICAID FL2 LEVEL OF CARE SCREENING TOOL     IDENTIFICATION  Patient Name: Alexandra [name] Birthdate: 08/12/1925 Sex: female Admission Date (Current Location): 07/11/2015  [facility] and [place] Number:  [employer] and Address:  [place] [address] [place]      Provider Number: [id number]  Attending Physician Name and Address:  Lucia Gaskins, MD  Relative Name and Phone Number:   Yuzuki, Splain, [phone])    Current Level of Care: Hospital Recommended Level of Care: [place] Prior Approval Number:    Date Approved/Denied:   PASRR Number:    Discharge Plan: Other (Comment) ([place])    Current Diagnoses: Patient Active Problem List   Diagnosis Date Noted  . Fall at home 07/12/2015  . Dementia 07/12/2015  . Dyslipidemia 07/12/2015  . RBBB-no old EKGs 07/12/2015  . Bradycardia-HR 52 on adm EKG 07/12/2015  . Elevated troponin 07/11/2015  . NSTEMI (non-ST elevated myocardial infarction) ([place]) 07/11/2015  . Elevated LFTs   . Altered mental status 11/12/2014  . Gastroenteritis 11/12/2014  . Fever 11/12/2014  . LEUKOPENIA, MILD 04/04/2009  . TRANSAMINASES, SERUM, ELEVATED 04/04/2009  . GRIEF REACTION 09/06/2008  . DECREASED HEARING 02/11/2008  . SPINAL STENOSIS 02/18/2007  . History of breast cancer 01/15/2007  . ANXIETY 01/15/2007  . PERIPHERAL NEUROPATHY 01/15/2007  . CATARACT NOS 01/15/2007  . ALLERGIC RHINITIS 01/15/2007  . ASTHMA 01/15/2007  . GERD 01/15/2007  . CONSTIPATION 01/15/2007  . IBS 01/15/2007  . ARTHRITIS 01/15/2007    Orientation RESPIRATION BLADDER Height & Weight    Self  Normal Continent 5\' 3"  (160 cm) 93 lbs.  BEHAVIORAL SYMPTOMS/MOOD NEUROLOGICAL BOWEL NUTRITION STATUS      Continent Diet (Diet Soft)  AMBULATORY STATUS COMMUNICATION OF NEEDS Skin   Limited Assist Verbally Normal                       Personal Care Assistance Level of  Assistance  Bathing, Dressing Bathing Assistance: Limited assistance   Dressing Assistance: Limited assistance     Functional Limitations Info             SPECIAL CARE FACTORS FREQUENCY                       Contractures      Additional Factors Info  Allergies, Psychotropic   Allergies Info: Aspirin, Chocolate, Penicillins (Aspirin, Chocolate, Penicillins) Psychotropic Info: Ativan (Ativan)         Current Medications (07/14/2015):  This is the current hospital active medication list Current Facility-Administered Medications  Medication Dose Route Frequency Provider Last Rate Last Dose  . 0.9 %  sodium chloride infusion   Intravenous Continuous Francine Graven, DO 75 mL/hr at 07/14/15 0533    . acetaminophen (TYLENOL) tablet 650 mg  650 mg Oral Q6H PRN Merton Border, MD       Or  . acetaminophen (TYLENOL) suppository 650 mg  650 mg Rectal Q6H PRN Merton Border, MD      . enoxaparin (LOVENOX) injection 30 mg  30 mg Subcutaneous Q24H Merton Border, MD   30 mg at 07/14/15 1112  . HYDROcodone-acetaminophen (NORCO/VICODIN) 5-325 MG per tablet 1-2 tablet  1-2 tablet Oral Q4H PRN Merton Border, MD      . LORazepam (ATIVAN) injection 0.5 mg  0.5 mg Intravenous Q4H PRN Merton Border, MD   0.5 mg at 07/13/15 0516  .  multivitamin with minerals tablet 1 tablet  1 tablet Oral Daily Merton Border, MD   1 tablet at 07/14/15 1111  . ondansetron (ZOFRAN) tablet 4 mg  4 mg Oral Q6H PRN Merton Border, MD       Or  . ondansetron (ZOFRAN) injection 4 mg  4 mg Intravenous Q6H PRN Merton Border, MD      . pantoprazole (PROTONIX) EC tablet 40 mg  40 mg Oral Daily Merton Border, MD   40 mg at 07/14/15 1112  . pravastatin (PRAVACHOL) tablet 40 mg  40 mg Oral Daily Merton Border, MD   40 mg at 07/14/15 1111  . sodium chloride 0.9 % injection 3 mL  3 mL Intravenous Q12H Merton Border, MD   3 mL at 07/11/15 2315     Discharge Medications: TAKE these medications       donepezil 10 MG tablet  Commonly known as:  ARICEPT  Take 1 tablet (10 mg total) by mouth at bedtime.     magnesium hydroxide 400 MG/5ML suspension  Commonly known as: MILK OF MAGNESIA  Take 30 mLs by mouth daily as needed for mild constipation.     multivitamin with minerals Tabs tablet  Take 1 tablet by mouth daily.     omeprazole 20 MG capsule  Commonly known as: PRILOSEC  Take 20 mg by mouth daily.     pravastatin 40 MG tablet  Commonly known as: PRAVACHOL  Take 40 mg by mouth daily.         Relevant Imaging Results:  Relevant Lab Results:   Additional Information    Ozan Maclay, Clydene Pugh, LCSW

## 2015-07-14 NOTE — Care Management Note (Signed)
Case Management Note  Patient Details  Name: LAYAH MADSON MRN: [id number] Date of Birth: [birth year]  Subjective/Objective:                    Action/Plan:   Expected Discharge Date:                  Expected Discharge Plan:  Assisted Living / Rest Home  In-House Referral:  Clinical Social Work  Discharge planning Services  CM Consult  Post Acute Care Choice:  NA Choice offered to:  NA  DME Arranged:    DME Agency:     HH Arranged:    [place] Agency:     Status of Service:  Completed, signed off  Medicare Important Message Given:  Yes Date Medicare IM Given:    Medicare IM give by:    Date Additional Medicare IM Given:    Additional Medicare Important Message give by:     If discussed at [place] of Stay Meetings, dates discussed:    Additional Comments: Pt discharged to [facility] today. CSW to arrange discharge to facility. Christinia Gully [place], RN 07/14/2015, 12:45 PM

## 2015-07-14 NOTE — Care Management Important Message (Signed)
Important Message  Patient Details  Name: JERONICA HIRES MRN: [id number] Date of Birth: [birth year]   Medicare Important Message Given:  Yes    Joylene Draft, RN 07/14/2015, 12:45 PM

## 2016-04-08 ENCOUNTER — Emergency Department (HOSPITAL_COMMUNITY): Payer: Medicare Other

## 2016-04-08 ENCOUNTER — Inpatient Hospital Stay (HOSPITAL_COMMUNITY)
Admission: EM | Admit: 2016-04-08 | Discharge: 2016-04-12 | DRG: 442 | Disposition: A | Discharge status: Nursing Home (Includes ICF) | Payer: Medicare Other | Attending: Family Medicine | Admitting: Family Medicine

## 2016-04-08 ENCOUNTER — Encounter (HOSPITAL_COMMUNITY): Payer: Self-pay | Admitting: Cardiology

## 2016-04-08 ENCOUNTER — Inpatient Hospital Stay (HOSPITAL_COMMUNITY): Payer: Medicare Other

## 2016-04-08 DIAGNOSIS — R627 Adult failure to thrive: Secondary | ICD-10-CM | POA: Diagnosis present

## 2016-04-08 DIAGNOSIS — Z853 Personal history of malignant neoplasm of breast: Secondary | ICD-10-CM

## 2016-04-08 DIAGNOSIS — E785 Hyperlipidemia, unspecified: Secondary | ICD-10-CM | POA: Diagnosis present

## 2016-04-08 DIAGNOSIS — Z886 Allergy status to analgesic agent status: Secondary | ICD-10-CM | POA: Diagnosis not present

## 2016-04-08 DIAGNOSIS — G6289 Other specified polyneuropathies: Secondary | ICD-10-CM | POA: Diagnosis present

## 2016-04-08 DIAGNOSIS — F0390 Unspecified dementia without behavioral disturbance: Secondary | ICD-10-CM | POA: Diagnosis present

## 2016-04-08 DIAGNOSIS — N390 Urinary tract infection, site not specified: Secondary | ICD-10-CM | POA: Diagnosis present

## 2016-04-08 DIAGNOSIS — Z91018 Allergy to other foods: Secondary | ICD-10-CM | POA: Diagnosis not present

## 2016-04-08 DIAGNOSIS — O864 Pyrexia of unknown origin following delivery: Secondary | ICD-10-CM

## 2016-04-08 DIAGNOSIS — R740 Nonspecific elevation of levels of transaminase and lactic acid dehydrogenase [LDH]: Secondary | ICD-10-CM | POA: Diagnosis present

## 2016-04-08 DIAGNOSIS — F4321 Adjustment disorder with depressed mood: Secondary | ICD-10-CM

## 2016-04-08 DIAGNOSIS — Z681 Body mass index (BMI) 19 or less, adult: Secondary | ICD-10-CM | POA: Diagnosis not present

## 2016-04-08 DIAGNOSIS — Z515 Encounter for palliative care: Secondary | ICD-10-CM

## 2016-04-08 DIAGNOSIS — Z9010 Acquired absence of unspecified breast and nipple: Secondary | ICD-10-CM | POA: Diagnosis not present

## 2016-04-08 DIAGNOSIS — R7989 Other specified abnormal findings of blood chemistry: Secondary | ICD-10-CM

## 2016-04-08 DIAGNOSIS — K219 Gastro-esophageal reflux disease without esophagitis: Secondary | ICD-10-CM

## 2016-04-08 DIAGNOSIS — H269 Unspecified cataract: Secondary | ICD-10-CM

## 2016-04-08 DIAGNOSIS — Z880 Allergy status to penicillin: Secondary | ICD-10-CM | POA: Diagnosis not present

## 2016-04-08 DIAGNOSIS — K589 Irritable bowel syndrome without diarrhea: Secondary | ICD-10-CM | POA: Diagnosis present

## 2016-04-08 DIAGNOSIS — Z87891 Personal history of nicotine dependence: Secondary | ICD-10-CM | POA: Diagnosis not present

## 2016-04-08 DIAGNOSIS — Z7189 Other specified counseling: Secondary | ICD-10-CM | POA: Diagnosis not present

## 2016-04-08 DIAGNOSIS — Z66 Do not resuscitate: Secondary | ICD-10-CM | POA: Diagnosis present

## 2016-04-08 DIAGNOSIS — Z79899 Other long term (current) drug therapy: Secondary | ICD-10-CM

## 2016-04-08 DIAGNOSIS — I252 Old myocardial infarction: Secondary | ICD-10-CM

## 2016-04-08 DIAGNOSIS — K759 Inflammatory liver disease, unspecified: Secondary | ICD-10-CM | POA: Diagnosis not present

## 2016-04-08 DIAGNOSIS — R7881 Bacteremia: Secondary | ICD-10-CM | POA: Diagnosis present

## 2016-04-08 DIAGNOSIS — I959 Hypotension, unspecified: Secondary | ICD-10-CM | POA: Diagnosis present

## 2016-04-08 DIAGNOSIS — R945 Abnormal results of liver function studies: Secondary | ICD-10-CM

## 2016-04-08 DIAGNOSIS — R7401 Elevation of levels of liver transaminase levels: Secondary | ICD-10-CM

## 2016-04-08 DIAGNOSIS — K2100 Gastro-esophageal reflux disease with esophagitis, without bleeding: Secondary | ICD-10-CM

## 2016-04-08 LAB — URINALYSIS, ROUTINE W REFLEX MICROSCOPIC
Glucose, UA: NEGATIVE mg/dL
LEUKOCYTES UA: NEGATIVE
Nitrite: NEGATIVE
PROTEIN: 100 mg/dL — AB
SPECIFIC GRAVITY, URINE: 1.02 (ref 1.005–1.030)
pH: 6 (ref 5.0–8.0)

## 2016-04-08 LAB — CBC WITH DIFFERENTIAL/PLATELET
BASOS ABS: 0 10*3/uL (ref 0.0–0.1)
Basophils Relative: 0 %
Eosinophils Absolute: 0 10*3/uL (ref 0.0–0.7)
Eosinophils Relative: 0 %
HEMATOCRIT: 37.9 % (ref 36.0–46.0)
HEMOGLOBIN: 12.9 g/dL (ref 12.0–15.0)
LYMPHS ABS: 0.4 10*3/uL — AB (ref 0.7–4.0)
LYMPHS PCT: 4 %
MCH: 33 pg (ref 26.0–34.0)
MCHC: 34 g/dL (ref 30.0–36.0)
MCV: 96.9 fL (ref 78.0–100.0)
Monocytes Absolute: 1 10*3/uL (ref 0.1–1.0)
Monocytes Relative: 10 %
NEUTROS ABS: 9 10*3/uL — AB (ref 1.7–7.7)
Neutrophils Relative %: 87 %
Platelets: 115 10*3/uL — ABNORMAL LOW (ref 150–400)
RBC: 3.91 MIL/uL (ref 3.87–5.11)
RDW: 13 % (ref 11.5–15.5)
WBC: 10.4 10*3/uL (ref 4.0–10.5)

## 2016-04-08 LAB — HEPATIC FUNCTION PANEL
ALK PHOS: 153 U/L — AB (ref 38–126)
ALT: 2073 U/L — AB (ref 14–54)
AST: 4948 U/L — AB (ref 15–41)
Albumin: 4 g/dL (ref 3.5–5.0)
BILIRUBIN DIRECT: 2.3 mg/dL — AB (ref 0.1–0.5)
BILIRUBIN INDIRECT: 1.2 mg/dL — AB (ref 0.3–0.9)
BILIRUBIN TOTAL: 3.5 mg/dL — AB (ref 0.3–1.2)
Total Protein: 6.9 g/dL (ref 6.5–8.1)

## 2016-04-08 LAB — BASIC METABOLIC PANEL
ANION GAP: 6 (ref 5–15)
BUN: 16 mg/dL (ref 6–20)
CHLORIDE: 105 mmol/L (ref 101–111)
CO2: 27 mmol/L (ref 22–32)
Calcium: 9.5 mg/dL (ref 8.9–10.3)
Creatinine, Ser: 0.83 mg/dL (ref 0.44–1.00)
GFR calc Af Amer: >60 mL/min (ref >60)
GLUCOSE: 105 mg/dL — AB (ref 65–99)
POTASSIUM: 3.6 mmol/L (ref 3.5–5.1)
SODIUM: 138 mmol/L (ref 135–145)

## 2016-04-08 LAB — URINE MICROSCOPIC-ADD ON

## 2016-04-08 LAB — LACTIC ACID, PLASMA: Lactic Acid, Venous: 1.8 mmol/L (ref 0.5–1.9)

## 2016-04-08 MED ORDER — IOPAMIDOL (ISOVUE-300) INJECTION 61%
INTRAVENOUS | Status: AC
  Filled 2016-04-08: qty 30

## 2016-04-08 MED ORDER — ONDANSETRON HCL 4 MG PO TABS: 4.0000 mg | ORAL_TABLET | Freq: Four times a day (QID) | ORAL | Status: DC | PRN

## 2016-04-08 MED ORDER — DOCUSATE SODIUM 100 MG PO CAPS
100.0000 mg | ORAL_CAPSULE | Freq: Two times a day (BID) | ORAL | Status: DC
  Administered 2016-04-09 – 2016-04-12 (×8): 100 mg via ORAL
  Filled 2016-04-08 (×8): qty 1

## 2016-04-08 MED ORDER — ONDANSETRON HCL 4 MG/2ML IJ SOLN: 4.0000 mg | Freq: Four times a day (QID) | INTRAMUSCULAR | Status: DC | PRN

## 2016-04-08 MED ORDER — ADULT MULTIVITAMIN W/MINERALS CH
1.0000 | ORAL_TABLET | Freq: Every day | ORAL | Status: DC
  Administered 2016-04-09 – 2016-04-12 (×4): 1 via ORAL
  Filled 2016-04-08 (×4): qty 1

## 2016-04-08 MED ORDER — ENOXAPARIN SODIUM 40 MG/0.4ML ~~LOC~~ SOLN
40.0000 mg | SUBCUTANEOUS | Status: DC
  Administered 2016-04-09: 40 mg via SUBCUTANEOUS
  Filled 2016-04-08: qty 0.4

## 2016-04-08 MED ORDER — PANTOPRAZOLE SODIUM 40 MG PO TBEC
40.0000 mg | DELAYED_RELEASE_TABLET | Freq: Every day | ORAL | Status: DC
  Administered 2016-04-09 – 2016-04-12 (×4): 40 mg via ORAL
  Filled 2016-04-08 (×4): qty 1

## 2016-04-08 MED ORDER — IOPAMIDOL (ISOVUE-300) INJECTION 61%
100.0000 mL | Freq: Once | INTRAVENOUS | Status: AC | PRN
  Administered 2016-04-08: 100 mL via INTRAVENOUS

## 2016-04-08 MED ORDER — SODIUM CHLORIDE 0.9 % IV BOLUS (SEPSIS)
1000.0000 mL | Freq: Once | INTRAVENOUS | Status: AC
  Administered 2016-04-08: 1000 mL via INTRAVENOUS

## 2016-04-08 MED ORDER — DONEPEZIL HCL 5 MG PO TABS
10.0000 mg | ORAL_TABLET | Freq: Every day | ORAL | Status: DC
  Administered 2016-04-09 – 2016-04-11 (×4): 10 mg via ORAL
  Filled 2016-04-08 (×4): qty 2

## 2016-04-08 MED ORDER — HYPROMELLOSE (GONIOSCOPIC) 2.5 % OP SOLN
2.0000 [drp] | OPHTHALMIC | Status: DC | PRN
  Filled 2016-04-08: qty 15

## 2016-04-08 MED ORDER — SODIUM CHLORIDE 0.9 % IV SOLN
INTRAVENOUS | Status: DC
  Administered 2016-04-09 – 2016-04-11 (×4): via INTRAVENOUS

## 2016-04-08 NOTE — ED Triage Notes (Addendum)
From an assisted living ,  Per caretaker pt has not been getting up out of bed as much lately.  Today,  She has not been out of bed at all and is not talking.  Pt c/o foot pain.

## 2016-04-08 NOTE — ED Provider Notes (Signed)
[facility] DEPT Provider Note   CSN: [id number] Arrival date & time: 04/08/16  1338  By signing my name below, I, Rayna Sexton, attest that this documentation has been prepared under the direction and in the presence of Milton Ferguson, MD. Electronically Signed: Rayna Sexton, ED Scribe. 04/08/16. 2:01 PM.   History   Chief Complaint Chief Complaint  Patient presents with  . Weakness   LEVEL FIVE CAVEAT: DEMENTIA  HPI HPI Comments: Alexandra [name] is a 82 y.o. female with a h/o dementia who presents to the Emergency Department by ambulance from an assisted living facility due to worsening weakness. Per EMS, her caretaker stated that she has not been getting out of bed as frequently and today she did not get out of bed at all. Pt is not clearly responding to provider's questions.   According to caregiver patient has been less responsive today not getting out of bed and has vomited one time   The history is provided by the EMS personnel and medical records. The history is limited by the condition of the patient. No language interpreter was used.  Weakness  Primary symptoms include no focal weakness. This is a new problem. The current episode started more than 2 days ago. The problem has been gradually worsening. There was no focality noted. There has been no fever.    Past Medical History:  Diagnosis Date  . Breast cancer ([place])   . Cataract   . Chronic neck and back pain   . Dementia   . Dementia   . Hyperlipidemia   . IBS (irritable bowel syndrome)     Patient Active Problem List   Diagnosis Date Noted  . Fall at home 07/12/2015  . Dementia 07/12/2015  . Dyslipidemia 07/12/2015  . RBBB-no old EKGs 07/12/2015  . Bradycardia-HR 52 on adm EKG 07/12/2015  . Elevated troponin 07/11/2015  . NSTEMI (non-ST elevated myocardial infarction) ([place]) 07/11/2015  . Elevated LFTs   . Altered mental status 11/12/2014  . Gastroenteritis 11/12/2014  . Fever 11/12/2014  .  LEUKOPENIA, MILD 04/04/2009  . TRANSAMINASES, SERUM, ELEVATED 04/04/2009  . GRIEF REACTION 09/06/2008  . DECREASED HEARING 02/11/2008  . SPINAL STENOSIS 02/18/2007  . History of breast cancer 01/15/2007  . ANXIETY 01/15/2007  . PERIPHERAL NEUROPATHY 01/15/2007  . CATARACT NOS 01/15/2007  . ALLERGIC RHINITIS 01/15/2007  . ASTHMA 01/15/2007  . GERD 01/15/2007  . CONSTIPATION 01/15/2007  . IBS 01/15/2007  . ARTHRITIS 01/15/2007    Past Surgical History:  Procedure Laterality Date  . MASTECTOMY      OB History    No data available       Home Medications    Prior to Admission medications   Medication Sig Start Date End Date Taking? Authorizing Provider  donepezil (ARICEPT) 10 MG tablet Take 1 tablet (10 mg total) by mouth at bedtime. 11/16/14   Lucia Gaskins, MD  magnesium hydroxide (MILK OF MAGNESIA) 400 MG/5ML suspension Take 30 mLs by mouth daily as needed for mild constipation.    Historical Provider, MD  Multiple Vitamin (MULTIVITAMIN WITH MINERALS) TABS tablet Take 1 tablet by mouth daily.    Historical Provider, MD  omeprazole (PRILOSEC) 20 MG capsule Take 20 mg by mouth daily. 10/20/14   Historical Provider, MD  pravastatin (PRAVACHOL) 40 MG tablet Take 40 mg by mouth daily. 10/20/14   Historical Provider, MD    Family History Family History  Problem Relation Age of Onset  . Colon cancer      unknown  Social History Social History  Substance Use Topics  . Smoking status: Former Research scientist (life sciences)  . Smokeless tobacco: Not on file  . Alcohol use No     Comment: in the past per patient     Allergies   Aspirin; Chocolate; and Penicillins   Review of Systems Review of Systems  Unable to perform ROS: Dementia  Neurological: Negative for focal weakness.   Physical Exam Updated Vital Signs BP 98/58   Pulse 95   Temp 98.5 F (36.9 C) (Oral)   Resp 16   Wt 93 lb (42.2 kg)   SpO2 98%   BMI 16.47 kg/m   Physical Exam  Constitutional:  Cachectic appearing     HENT:  Head: Normocephalic.  Mouth/Throat: Mucous membranes are dry.  Eyes: Conjunctivae and EOM are normal. No scleral icterus.  Neck: Neck supple. No thyromegaly present.  Cardiovascular: Normal rate and regular rhythm.  Exam reveals no gallop and no friction rub.   No murmur heard. Pulmonary/Chest: No stridor. She has no wheezes. She has no rales. She exhibits no tenderness.  Abdominal: Soft. She exhibits no distension. There is tenderness. There is no rebound.  Minimal tenderness across the abd  Musculoskeletal: Normal range of motion. She exhibits no edema.  Lymphadenopathy:    She has no cervical adenopathy.  Neurological: She is alert. She exhibits normal muscle tone. Coordination normal.  Alert and oriented to person only  Skin: No rash noted. No erythema.  Psychiatric: She has a normal mood and affect. Her behavior is normal.   ED Treatments / Results  Labs (all labs ordered are listed, but only abnormal results are displayed) Labs Reviewed  BASIC METABOLIC PANEL  CBC  URINALYSIS, ROUTINE W REFLEX MICROSCOPIC (NOT AT [facility])    EKG  EKG Interpretation None       Radiology No results found.  Procedures Procedures  DIAGNOSTIC STUDIES: Oxygen Saturation is 98% on RA, normal by my interpretation.    COORDINATION OF CARE: 1:57 PM Discussed next steps with pt. Pt verbalized understanding and is agreeable with the plan.    Medications Ordered in ED Medications - No data to display   Initial Impression / Assessment and Plan / ED Course  I have reviewed the triage vital signs and the nursing notes.  Pertinent labs & imaging results that were available during my care of the patient were reviewed by me and considered in my medical decision making (see chart for details).  Clinical Course   Patients with elevated liver studies. Along with bacteriuria. She will be admitted to medicine but also she is getting any belly scan for continued evaluation  The chart was  scribed for me under my direct supervision.  I personally performed the history, physical, and medical decision making and all procedures in the evaluation of this patient..  Final Clinical Impressions(s) / ED Diagnoses   Final diagnoses:  None    New Prescriptions New Prescriptions   No medications on file     Milton Ferguson, MD 04/08/16 1719

## 2016-04-08 NOTE — H&P (Signed)
History and Physical    Alexandra [name]:[id number] DOB: Jun 15, 1926 DOA: 04/08/2016  Referring MD/NP/PA: Milton Ferguson, EDP PCP: Maricela Curet, MD  Patient coming from: SNF  Chief Complaint: emesis  HPI: Alexandra [name] is a 82 y.o. female with h/o dementia and breast cancer. She is obtunded and I am unable to obtain any history from her. Staff at SNF says she has had some emesis. She visibly grimaces when her RUQ is palpated. Found to have significant transaminitis with AST 4948 and ALT 2073 with alk phos 153 and bili 3.5. Admission requested.  Past Medical/Surgical History: Past Medical History:  Diagnosis Date  . Breast cancer ([place])   . Cataract   . Chronic neck and back pain   . Dementia   . Dementia   . Hyperlipidemia   . IBS (irritable bowel syndrome)     Past Surgical History:  Procedure Laterality Date  . MASTECTOMY      Social History:  reports that she has quit smoking. She does not have any smokeless tobacco history on file. She reports that she does not drink alcohol or use drugs.  Allergies: Allergies  Allergen Reactions  . Aspirin     REACTION: "Messes up my heart and tells it to stop"  . Chocolate   . Penicillins     Has patient had a PCN reaction causing immediate rash, facial/tongue/throat swelling, SOB or lightheadedness with hypotension: unknown Has patient had a PCN reaction causing severe rash involving mucus membranes or skin necrosis: unknown Has patient had a PCN reaction that required hospitalization unknown Has patient had a PCN reaction occurring within the last 10 years: unknown If all of the above answers are "NO", then may proceed with Cephalosporin use. REACTION: Pains and feels it messes her up    Family History:  Family History  Problem Relation Age of Onset  . Colon cancer      unknown    Prior to Admission medications   Medication Sig Start Date End Date Taking? Authorizing Provider  acetaminophen (TYLENOL) 650 MG CR tablet  Take 650 mg by mouth every 4 (four) hours as needed for pain (mild pain).   Yes Historical Provider, MD  Alum & Mag Hydroxide-Simeth (ANTACID LIQUID PO) Take 30 mLs by mouth 4 (four) times daily as needed. twenty minutes to 1 hour after meals and at bedtime as needed for burning stomach   Yes Historical Provider, MD  anti-nausea (EMETROL) solution Take 15 mLs by mouth every 15 (fifteen) minutes as needed for nausea or vomiting.   Yes Historical Provider, MD  cetirizine (ZYRTEC) 10 MG tablet Take 10 mg by mouth daily.   Yes Historical Provider, MD  Dextromethorphan-Guaifenesin (TUSSIN DM) 10-100 MG/5ML liquid Take 15 mLs by mouth every 4 (four) hours as needed (for simple cough). Do not exceed 4 doses in 24 hours.   Yes Historical Provider, MD  donepezil (ARICEPT) 10 MG tablet Take 1 tablet (10 mg total) by mouth at bedtime. 11/16/14  Yes Lucia Gaskins, MD  hydroxypropyl methylcellulose / hypromellose (ISOPTO TEARS / GONIOVISC) 2.5 % ophthalmic solution 2 drops as needed (for eye lid irritation).   Yes Historical Provider, MD  liver oil-zinc oxide (DESITIN) 40 % ointment Apply 1 application topically as needed (for urine burn).   Yes Historical Provider, MD  Multiple Vitamin (MULTIVITAMIN WITH MINERALS) TABS tablet Take 1 tablet by mouth daily.   Yes Historical Provider, MD  neomycin-bacitracin-polymyxin (NEOSPORIN) ointment Apply 1 application topically as needed for wound care.  Wash affected area with soap and warm water first; then dry. Apply a small amount as needed for cuts or abrasions.   Yes Historical Provider, MD  omeprazole (PRILOSEC) 20 MG capsule Take 20 mg by mouth daily. 10/20/14  Yes Historical Provider, MD  phenol (CHLORASEPTIC) 1.4 % LIQD Use as directed 1 spray in the mouth or throat as needed for throat irritation / pain.   Yes Historical Provider, MD  pravastatin (PRAVACHOL) 40 MG tablet Take 40 mg by mouth daily. 10/20/14  Yes Historical Provider, MD  magnesium hydroxide (MILK OF  MAGNESIA) 400 MG/5ML suspension Take 30 mLs by mouth daily as needed for mild constipation.    Historical Provider, MD    Review of Systems:  Unable to obtain given current mental state.    Physical Exam: Vitals:   04/08/16 1530 04/08/16 1600 04/08/16 1700 04/08/16 1730  BP:  122/84 (!) 143/107 121/87  Pulse: 95 93  83  Resp: 25 24 24 24   Temp:      TempSrc:      SpO2: 98% 97%  100%  Weight:         Constitutional: NAD, calm, comfortable, cachectic Eyes: PERRL, lids and conjunctivae normal ENMT: Mucous membranes are dry. Posterior pharynx clear of any exudate or lesions.Normal dentition.  Neck: normal, supple, no masses, no thyromegaly Respiratory: clear to auscultation bilaterally, no wheezing, no crackles. Normal respiratory effort. No accessory muscle use.  Cardiovascular: Regular rate and rhythm, no murmurs / rubs / gallops. No extremity edema. 2+ pedal pulses. No carotid bruits.  Abdomen: grimaces to palpation of RUQ Musculoskeletal: no clubbing / cyanosis. No joint deformity upper and lower extremities. Good ROM, no contractures. Normal muscle tone.  Skin: no rashes, lesions, ulcers. No induration Neurologic: Unable to assess given current mental state.    Labs on Admission: I have personally reviewed the following labs and imaging studies  CBC:  Recent Labs Lab 04/08/16 1410  WBC 10.4  NEUTROABS 9.0*  HGB 12.9  HCT 37.9  MCV 96.9  PLT 211*   Basic Metabolic Panel:  Recent Labs Lab 04/08/16 1410  NA 138  K 3.6  CL 105  CO2 27  GLUCOSE 105*  BUN 16  CREATININE 0.83  CALCIUM 9.5   GFR: CrCl cannot be calculated (Unknown ideal weight.). Liver Function Tests:  Recent Labs Lab 04/08/16 1410  AST 4,948*  ALT 2,073*  ALKPHOS 153*  BILITOT 3.5*  PROT 6.9  ALBUMIN 4.0   No results for input(s): LIPASE, AMYLASE in the last 168 hours. No results for input(s): AMMONIA in the last 168 hours. Coagulation Profile: No results for input(s): INR,  PROTIME in the last 168 hours. Cardiac Enzymes: No results for input(s): CKTOTAL, CKMB, CKMBINDEX, TROPONINI in the last 168 hours. BNP (last 3 results) No results for input(s): PROBNP in the last 8760 hours. HbA1C: No results for input(s): HGBA1C in the last 72 hours. CBG: No results for input(s): GLUCAP in the last 168 hours. Lipid Profile: No results for input(s): CHOL, HDL, LDLCALC, TRIG, CHOLHDL, LDLDIRECT in the last 72 hours. Thyroid Function Tests: No results for input(s): TSH, T4TOTAL, FREET4, T3FREE, THYROIDAB in the last 72 hours. Anemia Panel: No results for input(s): VITAMINB12, FOLATE, FERRITIN, TIBC, IRON, RETICCTPCT in the last 72 hours. Urine analysis:    Component Value Date/Time   COLORURINE YELLOW 04/08/2016 [place] 04/08/2016 1349   LABSPEC 1.020 04/08/2016 1349   PHURINE 6.0 04/08/2016 [place] 04/08/2016 1349  HGBUR MODERATE (A) 04/08/2016 1349   BILIRUBINUR SMALL (A) 04/08/2016 1349   KETONESUR TRACE (A) 04/08/2016 1349   PROTEINUR 100 (A) 04/08/2016 1349   UROBILINOGEN 4.0 (H) 11/11/2014 2320   NITRITE NEGATIVE 04/08/2016 1349   LEUKOCYTESUR NEGATIVE 04/08/2016 1349   Sepsis Labs: @LABRCNTIP (procalcitonin:4,lacticidven:4) )No results found for this or any previous visit (from the past 240 hour(s)).   Radiological Exams on Admission: Dg Chest 2 View  Result Date: 04/08/2016 CLINICAL DATA:  Weakness, history of breast cancer, dementia EXAM: CHEST  2 VIEW COMPARISON:  11/11/2014 FINDINGS: The heart size and mediastinal contours are within normal limits. Both lungs are clear. The visualized skeletal structures are unremarkable. IMPRESSION: No active cardiopulmonary disease. Electronically Signed   By: Kathreen Devoid   On: 04/08/2016 15:11   Ct Head Wo Contrast  Result Date: 04/08/2016 CLINICAL DATA:  Altered level of consciousness.  Dementia. EXAM: CT HEAD WITHOUT CONTRAST TECHNIQUE: Contiguous axial images were obtained  from the base of the skull through the vertex without intravenous contrast. COMPARISON:  07/11/2015 FINDINGS: Brain: Stable cerebral atrophy with stable enlargement of the ventricles. Stable low-density in the periventricular white matter. Negative for acute hemorrhage, mass lesion, midline shift or large infarct. Vascular: No hyperdense vessel or unexpected calcification. Skull: Normal. Negative for fracture or focal lesion. Sinuses/Orbits: No acute finding. Other: None. IMPRESSION: No acute intracranial abnormality. Stable atrophy and ventricular dilatation. Electronically Signed   By: Markus Daft M.D.   On: 04/08/2016 15:19    EKG: Independently reviewed. NSR, BBB  Assessment/Plan Principal Problem:   Transaminitis Active Problems:   Dementia    Transaminitis -alk phos/bili relatively normal compared to AST/ALT. -Ct scan ordered in ED (pending at time of admission). -Will request RUQ [place], viral hepatitis serologies. -DC statin for now. -Avoid tylenol. -Recheck LFTs in am; further recommendations depending on study results.  Dementia -Continue aricept. -Given age, dementia and FTT will request palliative care consultation.   DVT prophylaxis: Lovenox  Code Status: Full Code  Family Communication: none  Disposition Plan: pending  Consults called: palliative care  Admission status: inpatient    Time Spent: 75 minutes  Lelon Frohlich MD [facility] Pager [phone]  If 7PM-7AM, please contact night-coverage [email or web address] Password [facility]  04/08/2016, 6:42 PM

## 2016-04-08 NOTE — ED Notes (Signed)
CONTACT PERSON;  [place] Form CAREGIVER [phone]

## 2016-04-09 ENCOUNTER — Encounter (HOSPITAL_COMMUNITY): Payer: Self-pay | Admitting: Primary Care

## 2016-04-09 ENCOUNTER — Inpatient Hospital Stay (HOSPITAL_COMMUNITY): Payer: Medicare Other

## 2016-04-09 DIAGNOSIS — R740 Nonspecific elevation of levels of transaminase and lactic acid dehydrogenase [LDH]: Secondary | ICD-10-CM

## 2016-04-09 DIAGNOSIS — Z515 Encounter for palliative care: Secondary | ICD-10-CM

## 2016-04-09 DIAGNOSIS — Z7189 Other specified counseling: Secondary | ICD-10-CM

## 2016-04-09 LAB — RAPID HIV SCREEN (HIV 1/2 AB+AG)
HIV 1/2 Antibodies: NONREACTIVE
HIV-1 P24 ANTIGEN - HIV24: NONREACTIVE

## 2016-04-09 LAB — COMPREHENSIVE METABOLIC PANEL
ALT: 1135 U/L — AB (ref 14–54)
AST: 1438 U/L — AB (ref 15–41)
Albumin: 3.3 g/dL — ABNORMAL LOW (ref 3.5–5.0)
Alkaline Phosphatase: 122 U/L (ref 38–126)
Anion gap: 4 — ABNORMAL LOW (ref 5–15)
BUN: 13 mg/dL (ref 6–20)
CHLORIDE: 108 mmol/L (ref 101–111)
CO2: 28 mmol/L (ref 22–32)
CREATININE: 0.6 mg/dL (ref 0.44–1.00)
Calcium: 8.6 mg/dL — ABNORMAL LOW (ref 8.9–10.3)
GFR calc Af Amer: >60 mL/min (ref >60)
GFR calc non Af Amer: >60 mL/min (ref >60)
Glucose, Bld: 74 mg/dL (ref 65–99)
Potassium: 3.6 mmol/L (ref 3.5–5.1)
SODIUM: 140 mmol/L (ref 135–145)
Total Bilirubin: 3.5 mg/dL — ABNORMAL HIGH (ref 0.3–1.2)
Total Protein: 5.8 g/dL — ABNORMAL LOW (ref 6.5–8.1)

## 2016-04-09 LAB — AMMONIA: AMMONIA: 11 umol/L (ref 9–35)

## 2016-04-09 LAB — CBC
HCT: 32.6 % — ABNORMAL LOW (ref 36.0–46.0)
HEMATOCRIT: 32.4 % — AB (ref 36.0–46.0)
Hemoglobin: 11.2 g/dL — ABNORMAL LOW (ref 12.0–15.0)
Hemoglobin: 11.2 g/dL — ABNORMAL LOW (ref 12.0–15.0)
MCH: 33.1 pg (ref 26.0–34.0)
MCH: 33.2 pg (ref 26.0–34.0)
MCHC: 34.4 g/dL (ref 30.0–36.0)
MCHC: 34.6 g/dL (ref 30.0–36.0)
MCV: 96.4 fL (ref 78.0–100.0)
MCV: 96.1 fL (ref 78.0–100.0)
PLATELETS: 95 10*3/uL — AB (ref 150–400)
Platelets: 94 10*3/uL — ABNORMAL LOW (ref 150–400)
RBC: 3.38 MIL/uL — ABNORMAL LOW (ref 3.87–5.11)
RBC: 3.37 MIL/uL — AB (ref 3.87–5.11)
RDW: 13 % (ref 11.5–15.5)
RDW: 13 % (ref 11.5–15.5)
WBC: 8.2 10*3/uL (ref 4.0–10.5)
WBC: 7.6 10*3/uL (ref 4.0–10.5)

## 2016-04-09 LAB — VITAMIN B12: Vitamin B-12: 2420 pg/mL — ABNORMAL HIGH (ref 180–914)

## 2016-04-09 LAB — TSH: TSH: 0.505 u[IU]/mL (ref 0.350–4.500)

## 2016-04-09 LAB — SEDIMENTATION RATE: Sed Rate: 7 mm/hr (ref 0–22)

## 2016-04-09 LAB — ACETAMINOPHEN LEVEL

## 2016-04-09 LAB — PROTIME-INR
INR: 1.45
Prothrombin Time: 17.8 seconds — ABNORMAL HIGH (ref 11.4–15.2)

## 2016-04-09 MED ORDER — ENOXAPARIN SODIUM 30 MG/0.3ML ~~LOC~~ SOLN
30.0000 mg | SUBCUTANEOUS | Status: DC
  Administered 2016-04-09 – 2016-04-11 (×3): 30 mg via SUBCUTANEOUS
  Filled 2016-04-09 (×3): qty 0.3

## 2016-04-09 MED ORDER — GUAIFENESIN-DM 100-10 MG/5ML PO SYRP
10.0000 mL | ORAL_SOLUTION | ORAL | Status: DC | PRN
  Administered 2016-04-09 (×2): 10 mL via ORAL
  Filled 2016-04-09 (×2): qty 10

## 2016-04-09 MED ORDER — POLYVINYL ALCOHOL 1.4 % OP SOLN: 2.0000 [drp] | OPHTHALMIC | Status: DC | PRN

## 2016-04-09 NOTE — Progress Notes (Signed)
Alexandra [name] DJM:[id number] DOB: 06/13/1926 DOA: 04/08/2016 PCP: Maricela Curet, MD   Physical Exam: Blood pressure (!) 126/59, pulse 66, temperature 97.3 F (36.3 C), temperature source Oral, resp. rate 20, weight 36.4 kg (80 lb 4.8 oz), SpO2 95 %. Lungs clear to A&P no rales wheeze rhonchi heart regular rhythm no murmurs gallops heaves thrills rubs no significant right upper quadrant tenderness on palpation bowel sounds are normoactive no guarding or rebound no peristaltic rushes auscultated   Investigations:  No results found for this or any previous visit (from the past 240 hour(s)).   Basic Metabolic Panel:  Recent Labs  04/08/16 1410 04/09/16 0539  NA 138 140  K 3.6 3.6  CL 105 108  CO2 27 28  GLUCOSE 105* 74  BUN 16 13  CREATININE 0.83 0.60  CALCIUM 9.5 8.6*   Liver Function Tests:  Recent Labs  04/08/16 1410 04/09/16 0539  AST 4,948* 1,438*  ALT 2,073* 1,135*  ALKPHOS 153* 122  BILITOT 3.5* 3.5*  PROT 6.9 5.8*  ALBUMIN 4.0 3.3*     CBC:  Recent Labs  04/08/16 1410 04/09/16 0539  WBC 10.4 8.2  NEUTROABS 9.0*  --   HGB 12.9 11.2*  HCT 37.9 32.6*  MCV 96.9 96.4  PLT 115* 95*    Dg Chest 2 View  Result Date: 04/08/2016 CLINICAL DATA:  Weakness, history of breast cancer, dementia EXAM: CHEST  2 VIEW COMPARISON:  11/11/2014 FINDINGS: The heart size and mediastinal contours are within normal limits. Both lungs are clear. The visualized skeletal structures are unremarkable. IMPRESSION: No active cardiopulmonary disease. Electronically Signed   By: Kathreen Devoid   On: 04/08/2016 15:11   Ct Head Wo Contrast  Result Date: 04/08/2016 CLINICAL DATA:  Altered level of consciousness.  Dementia. EXAM: CT HEAD WITHOUT CONTRAST TECHNIQUE: Contiguous axial images were obtained from the base of the skull through the vertex without intravenous contrast. COMPARISON:  07/11/2015 FINDINGS: Brain: Stable cerebral atrophy with stable enlargement of the ventricles. Stable  low-density in the periventricular white matter. Negative for acute hemorrhage, mass lesion, midline shift or large infarct. Vascular: No hyperdense vessel or unexpected calcification. Skull: Normal. Negative for fracture or focal lesion. Sinuses/Orbits: No acute finding. Other: None. IMPRESSION: No acute intracranial abnormality. Stable atrophy and ventricular dilatation. Electronically Signed   By: Markus Daft M.D.   On: 04/08/2016 15:19   Ct Abdomen Pelvis W Contrast  Result Date: 04/08/2016 CLINICAL DATA:  Right upper quadrant pain EXAM: CT ABDOMEN AND PELVIS WITH CONTRAST TECHNIQUE: Multidetector CT imaging of the abdomen and pelvis was performed using the standard protocol following bolus administration of intravenous contrast. CONTRAST:  187m ISOVUE-300 IOPAMIDOL (ISOVUE-300) INJECTION 61% COMPARISON:  None. FINDINGS: Lower chest: No pleural or pericardial effusion. Subpleural consolidation within the right base is noted, image 5 of series 6. Hepatobiliary: No focal liver abnormality. Multiple stones are identified within the gallbladder. There is mild pericholecystic fluid noted. Pancreas: Unremarkable. No pancreatic ductal dilatation or surrounding inflammatory changes. Spleen: Normal in size without focal abnormality. Adrenals/Urinary Tract: Adrenal glands are unremarkable. There is a cyst within the inferior pole of the right kidney noted CED measuring 3.2 cm, image 24 of series 2. Left kidney is normal, without renal calculi, focal lesion, or hydronephrosis. Bladder is unremarkable. Stomach/Bowel: The stomach is within normal limits. The small bowel loops have a normal course and caliber. No obstruction. Normal appearance of the colon. Numerous distal colonic diverticula identified. Mild wall thickening involving the sigmoid colon is identified. Vascular/Lymphatic:  Calcified atherosclerotic disease involves the abdominal aorta. No aneurysm. No enlarged retroperitoneal or mesenteric adenopathy. No  enlarged pelvic or inguinal lymph nodes. Reproductive: The uterus is not visualized and may be surgically absent or atrophic. No adnexal masses. Other: There is a small amount of free fluid noted within the dependent portion of the pelvis. Musculoskeletal: Degenerative disc disease identified within the lumbar spine. IMPRESSION: 1. Gallstones and mild pericholecystic fluid. If there is a concern for acute cholecystitis consider further investigation with abdominal sonogram. 2. Small amount of free fluid noted within the dependent portion of the pelvis. 3. Sigmoid diverticulosis. Mild wall thickening involving the sigmoid colon is noted. Correlation for any clinical signs or symptoms of cholecystitis. Electronically Signed   By: Kerby Moors M.D.   On: 04/08/2016 21:02   US Abdomen Limited Ruq  Result Date: 04/09/2016 CLINICAL DATA:  Elevated liver function studies. History of kidney stones. EXAM: US ABDOMEN LIMITED - RIGHT UPPER QUADRANT COMPARISON:  Renal ultrasound of Nov 14, 2014 FINDINGS: Gallbladder: There are multiple echogenic mobile shadowing stones. There is mild gallbladder wall thickening to 4 mm. There is no pericholecystic fluid or positive sonographic Murphy's sign. Common bile duct: Diameter: 2.6 mm Liver: The hepatic echotexture is slightly decreased overall with prominence of the portal triads. There is no focal mass or ductal dilation. IMPRESSION: Multiple mobile gallstones with mild chronic thickening of the gallbladder wall to 4 mm. No positive sonographic Murphy's sign. Mildly decreased hepatic echotexture chronically without discrete mass or ductal dilation. Electronically Signed   By: David  Martinique M.D.   On: 04/09/2016 09:12      Medications:   Impression: Principal Problem:   Transaminitis Active Problems:   Dementia     Plan: Awaiting serologies for viral hepatitis. We will monitor her hepatic profile daily and be met daily we'll obtain gastroenterology consultation  regarding significant transaminitis elevation. Statin discontinued. Serum ammonia level ordered  Consultants: Gastroenterology requested   Procedures   Antibiotics:           Time spent: 30 minutes   LOS: 1 day   Dymond Gutt M   04/09/2016, 1:03 PM

## 2016-04-09 NOTE — Progress Notes (Signed)
MEDICATION RELATED CONSULT NOTE - INITIAL   Pharmacy Consult for Acetadote Indication: Transaminitis  Allergies  Allergen Reactions  . Aspirin     REACTION: "Messes up my heart and tells it to stop"  . Chocolate   . Penicillins     Has patient had a PCN reaction causing immediate rash, facial/tongue/throat swelling, SOB or lightheadedness with hypotension: unknown Has patient had a PCN reaction causing severe rash involving mucus membranes or skin necrosis: unknown Has patient had a PCN reaction that required hospitalization unknown Has patient had a PCN reaction occurring within the last 10 years: unknown If all of the above answers are "NO", then may proceed with Cephalosporin use. REACTION: Pains and feels it messes her up    Patient Measurements: Weight: 80 lb 4.8 oz (36.4 kg)  Vital Signs: Temp: 97.8 F (36.6 C) (09/26 1419) Temp Source: Oral (09/26 1419) BP: 114/45 (09/26 1419) Pulse Rate: 58 (09/26 1419) Intake/Output from previous day: 09/25 0701 - 09/26 0700 In: 1000 [IV Piggyback:1000] Out: -  Intake/Output from this shift: Total I/O In: 741.3 [P.O.:120; I.V.:621.3] Out: -   Labs:  Recent Labs  04/08/16 1410 04/09/16 0539 04/09/16 1400  WBC 10.4 8.2 7.6  HGB 12.9 11.2* 11.2*  HCT 37.9 32.6* 32.4*  PLT 115* 95* 94*  CREATININE 0.83 0.60  --   ALBUMIN 4.0 3.3*  --   PROT 6.9 5.8*  --   AST 4,948* 1,438*  --   ALT 2,073* 1,135*  --   ALKPHOS 153* 122  --   BILITOT 3.5* 3.5*  --   BILIDIR 2.3*  --   --   IBILI 1.2*  --   --    CrCl cannot be calculated (Unknown ideal weight.).   Microbiology: No results found for this or any previous visit (from the past 720 hour(s)).  Medical History: Past Medical History:  Diagnosis Date  . Breast cancer ([place])   . Cataract   . Chronic neck and back pain   . Dementia   . Dementia   . Hyperlipidemia   . IBS (irritable bowel syndrome)     Medications:  Prescriptions Prior to Admission  Medication Sig  Dispense Refill Last Dose  . acetaminophen (TYLENOL) 650 MG CR tablet Take 650 mg by mouth every 4 (four) hours as needed for pain (mild pain).   04/08/2016 at Unknown time  . Alum & Mag Hydroxide-Simeth (ANTACID LIQUID PO) Take 30 mLs by mouth 4 (four) times daily as needed. twenty minutes to 1 hour after meals and at bedtime as needed for burning stomach   unknown  . anti-nausea (EMETROL) solution Take 15 mLs by mouth every 15 (fifteen) minutes as needed for nausea or vomiting.   unknown  . cetirizine (ZYRTEC) 10 MG tablet Take 10 mg by mouth daily.   04/08/2016 at Unknown time  . Dextromethorphan-Guaifenesin (TUSSIN DM) 10-100 MG/5ML liquid Take 15 mLs by mouth every 4 (four) hours as needed (for simple cough). Do not exceed 4 doses in 24 hours.   unknown  . donepezil (ARICEPT) 10 MG tablet Take 1 tablet (10 mg total) by mouth at bedtime. 30 tablet 3 04/07/2016 at Unknown time  . hydroxypropyl methylcellulose / hypromellose (ISOPTO TEARS / GONIOVISC) 2.5 % ophthalmic solution 2 drops as needed (for eye lid irritation).   04/08/2016 at Unknown time  . liver oil-zinc oxide (DESITIN) 40 % ointment Apply 1 application topically as needed (for urine burn).   unknown  . Multiple Vitamin (MULTIVITAMIN WITH MINERALS)  TABS tablet Take 1 tablet by mouth daily.   04/08/2016 at Unknown time  . neomycin-bacitracin-polymyxin (NEOSPORIN) ointment Apply 1 application topically as needed for wound care. Wash affected area with soap and warm water first; then dry. Apply a small amount as needed for cuts or abrasions.   unknown  . omeprazole (PRILOSEC) 20 MG capsule Take 20 mg by mouth daily.   04/08/2016 at Unknown time  . phenol (CHLORASEPTIC) 1.4 % LIQD Use as directed 1 spray in the mouth or throat as needed for throat irritation / pain.   unknown  . pravastatin (PRAVACHOL) 40 MG tablet Take 40 mg by mouth daily.   04/08/2016 at Unknown time  . magnesium hydroxide (MILK OF MAGNESIA) 400 MG/5ML suspension Take 30 mLs by  mouth daily as needed for mild constipation.   unknown    Assessment: 82 y.o. female with h/o dementia and breast cancer. She was obtunded in ED. Her labs were significant for transaminitis with AST 4948 and ALT 2073 with alk phos 153 and bili 3.5. LFT's today are trending down with AST 1438 and ALT 1135, total bili still 3.5.  Reviewed medications from [facility] facilities and contacted them regarding acetaminophen intake. The medication administration record, per assistant at [facility], said patient had not received any acetaminophen in the last month.  Contacted poison control and information was reviewed with them. With trending LFT's improving, and patient hasn't received acetaminophen at facility, they did not recommend Acetylcysteine administration. They did recommend an acetaminophen level and PT-INR and continue monitoring of LFT's. Discussed with Dr. Oneida Alar the recommendations from Poison control and will not proceed with acetylcysteine.  Plan:  Acetaminophen level PT-INR Monitor LFT's   Isac Sarna, BS [place], BCPS Clinical Pharmacist Pager [phone] 04/09/2016,4:17 PM

## 2016-04-09 NOTE — Consult Note (Signed)
Consultation Note Date: 04/09/2016   Patient Name: Alexandra [name]  DOB: Nov 30, 1925  MRN: [id number]  Age / Sex: 82 y.o., female  PCP: Lucia Gaskins, MD Referring Physician: Lucia Gaskins, MD  Reason for Consultation: Establishing goals of care and Psychosocial/spiritual support  HPI/Patient Profile: 82 y.o. female  with past medical history of Dementia, hyperlipidemia, IBS with constipation, breast cancer with mastectomy, chronic neck and back pain, spinal stenosis admitted on 04/08/2016 with transaminitis.   Clinical Assessment and Goals of Care: Alexandra [name] is resting quietly in bed. She smiles at me, making and keeping eye contact as I enter. She is pleasantly confused, oriented to person. She denies pain except when she "moves a certain way". She tells me she is not hungry, and is getting enough to eat. There is no family at bedside at this time. Call to [facility] Form at [phone]. Worker states Mr. Hassell Done is not at the facility at present gives Alexandra cell phone [phone], unable to leave voicemail message. Call to son/HCPOA, Axel Hackett at [phone].  Iona Beard states that he lives in [place], does not drive, and is not able to come visit Alexandra [name] in [place] as often as he would like.  We talk about Alexandra [name] current and chronic health concerns. We review Alexandra labs, liver function test improving, albumin normal, we also review abdominal CT.  We talk about Alexandra [name] functional status.  Iona Beard states that she is falling a lot, but is still able to recognize Alexandra voice on the telephone, and feed herself. He states that when he calls to speak with Alexandra [name], the workers share that she is napping often. We talk about the chronic illness trajectory of dementia, including decreased mobility and the sequela thereof, decreased PO intake, changes in room or situation worsening behaviors. Iona Beard states  that Alexandra [name] does have problem taking a lot of pills at times, she believed in roots and teas. Iona Beard states that he understands this is part of the process for Alexandra [name] and is accepting. He shares that Alexandra father also passed at age 109. We talk about healthcare power of attorney (see below). We also talk about advanced directives and the concept of allowing natural death. Iona Beard states he would elect a DNR for Alexandra [name] at this time, knowing Alexandra idea of "rejecting modern medicine".  I share that the medical team supports him in this kind decision for Alexandra [name], and that this in no way interferes with Alexandra care. We will continue to treat Alexandra medically, spiritually, and emotionally. No further questions at this time. I share with Iona Beard that I will call him tomorrow with an update after speaking with Alexandra primary care provider, Dr. Lorriane Shire.  Healthcare power of attorney NEXT OF [place], [place], [place]. Iona Beard lives in [place] and does not drive. He has difficulty with transportation to [place]. He shares that he and Alexandra [name] are the only ones left in Alexandra family.   SUMMARY OF RECOMMENDATIONS  continue to treat the treatable, but no extraordinary measures such as CPR or intubation.  Iona Beard is requesting help with audiologist appointment and hearing aids for Alexandra [name] if possible.  Code Status/Advance Care Planning:  DNR - we discussed the concepts of allow a natural death. Iona Beard elects DNR for Alexandra [name]. I share that this does not mean do not treat.  Symptom Management:   per Dr. Lorriane Shire  Palliative Prophylaxis:   Frequent Pain Assessment and Turn Reposition  Additional Recommendations (Limitations, Scope, Preferences):  Continue to treat the treatable but no extraordinary measures such as CPR or intubation. I broach the subject of peg tube, and encourage Iona Beard to consider if this would be right for Alexandra [name] if needed, in the future.  Psycho-social/Spiritual:     Desire for further Chaplaincy support:no  Additional Recommendations: Caregiving  Support/Resources and Education on Hospice  Prognosis:   Unable to determine, based on outcomes. Possibly 6 months or less related to frailty, weight loss, dementia.   Discharge Planning: Return to "[facility] group home on [address].      Primary Diagnoses: Present on Admission: . (Resolved) Hepatitis . Dementia   I have reviewed the medical record, interviewed the patient and family, and examined the patient. The following aspects are pertinent.  Past Medical History:  Diagnosis Date  . Breast cancer ([place])   . Cataract   . Chronic neck and back pain   . Dementia   . Dementia   . Hyperlipidemia   . IBS (irritable bowel syndrome)    Social History   Social History  . Marital status: Widowed    Spouse name: N/A  . Number of children: N/A  . Years of education: N/A   Social History Main Topics  . Smoking status: Former Research scientist (life sciences)  . Smokeless tobacco: None  . Alcohol use No     Comment: in the past per patient  . Drug use: No  . Sexual activity: Not Asked   Other Topics Concern  . None   Social History Narrative  . None   Family History  Problem Relation Age of Onset  . Colon cancer      unknown   Scheduled Meds: . docusate sodium  100 mg Oral BID  . donepezil  10 mg Oral QHS  . enoxaparin (LOVENOX) injection  30 mg Subcutaneous Q24H  . multivitamin with minerals  1 tablet Oral Daily  . pantoprazole  40 mg Oral Daily   Continuous Infusions: . sodium chloride 75 mL/hr at 04/09/16 0015   PRN Meds:.ondansetron **OR** ondansetron (ZOFRAN) IV, polyvinyl alcohol Medications Prior to Admission:  Prior to Admission medications   Medication Sig Start Date End Date Taking? Authorizing Provider  acetaminophen (TYLENOL) 650 MG CR tablet Take 650 mg by mouth every 4 (four) hours as needed for pain (mild pain).   Yes Historical Provider, MD  Alum & Mag Hydroxide-Simeth  (ANTACID LIQUID PO) Take 30 mLs by mouth 4 (four) times daily as needed. twenty minutes to 1 hour after meals and at bedtime as needed for burning stomach   Yes Historical Provider, MD  anti-nausea (EMETROL) solution Take 15 mLs by mouth every 15 (fifteen) minutes as needed for nausea or vomiting.   Yes Historical Provider, MD  cetirizine (ZYRTEC) 10 MG tablet Take 10 mg by mouth daily.   Yes Historical Provider, MD  Dextromethorphan-Guaifenesin (TUSSIN DM) 10-100 MG/5ML liquid Take 15 mLs by mouth every 4 (four) hours as needed (for simple cough). Do not  exceed 4 doses in 24 hours.   Yes Historical Provider, MD  donepezil (ARICEPT) 10 MG tablet Take 1 tablet (10 mg total) by mouth at bedtime. 11/16/14  Yes Lucia Gaskins, MD  hydroxypropyl methylcellulose / hypromellose (ISOPTO TEARS / GONIOVISC) 2.5 % ophthalmic solution 2 drops as needed (for eye lid irritation).   Yes Historical Provider, MD  liver oil-zinc oxide (DESITIN) 40 % ointment Apply 1 application topically as needed (for urine burn).   Yes Historical Provider, MD  Multiple Vitamin (MULTIVITAMIN WITH MINERALS) TABS tablet Take 1 tablet by mouth daily.   Yes Historical Provider, MD  neomycin-bacitracin-polymyxin (NEOSPORIN) ointment Apply 1 application topically as needed for wound care. Wash affected area with soap and warm water first; then dry. Apply a small amount as needed for cuts or abrasions.   Yes Historical Provider, MD  omeprazole (PRILOSEC) 20 MG capsule Take 20 mg by mouth daily. 10/20/14  Yes Historical Provider, MD  phenol (CHLORASEPTIC) 1.4 % LIQD Use as directed 1 spray in the mouth or throat as needed for throat irritation / pain.   Yes Historical Provider, MD  pravastatin (PRAVACHOL) 40 MG tablet Take 40 mg by mouth daily. 10/20/14  Yes Historical Provider, MD  magnesium hydroxide (MILK OF MAGNESIA) 400 MG/5ML suspension Take 30 mLs by mouth daily as needed for mild constipation.    Historical Provider, MD   Allergies    Allergen Reactions  . Aspirin     REACTION: "Messes up my heart and tells it to stop"  . Chocolate   . Penicillins     Has patient had a PCN reaction causing immediate rash, facial/tongue/throat swelling, SOB or lightheadedness with hypotension: unknown Has patient had a PCN reaction causing severe rash involving mucus membranes or skin necrosis: unknown Has patient had a PCN reaction that required hospitalization unknown Has patient had a PCN reaction occurring within the last 10 years: unknown If all of the above answers are "NO", then may proceed with Cephalosporin use. REACTION: Pains and feels it messes Alexandra up   Review of Systems  Unable to perform ROS: Dementia    Physical Exam  Constitutional: No distress.  Frail and thin.  HENT:  Head: Normocephalic and atraumatic.  Cardiovascular: Normal rate and regular rhythm.   Pulmonary/Chest: Effort normal. No respiratory distress.  Abdominal: Soft. She exhibits no distension. There is no guarding.  Musculoskeletal:  Muscle wasting  Neurological: She is alert.  Demented, pleasant. Oriented to person  Skin: Skin is warm and dry.  Nursing note and vitals reviewed.   Vital Signs: BP (!) 126/59 (BP Location: Right Arm)   Pulse 66   Temp 97.3 F (36.3 C) (Oral)   Resp 20   Wt 36.4 kg (80 lb 4.8 oz)   SpO2 95%   BMI 14.22 kg/m  Pain Assessment: PAINAD       SpO2: SpO2: 95 % O2 Device:SpO2: 95 % O2 Flow Rate: .   IO: Intake/output summary:  Intake/Output Summary (Last 24 hours) at 04/09/16 1211 Last data filed at 04/09/16 0948  Gross per 24 hour  Intake             1120 ml  Output                0 ml  Net             1120 ml    LBM:   Baseline Weight: Weight: 42.2 kg (93 lb) Most recent weight: Weight: 36.4 kg (80 lb 4.8  oz)     Palliative Assessment/Data:   Flowsheet Rows   Flowsheet Row Most Recent Value  Intake Tab  Referral Department  Hospitalist  Unit at Time of Referral  Med/Surg Unit  Palliative  Care Primary Diagnosis  Other (Comment)  Date Notified  04/08/16  Palliative Care Type  New Palliative care  Reason for referral  Clarify Goals of Care  Date of Admission  04/08/16  Date first seen by Palliative Care  04/09/16  # of days Palliative referral response time  1 Day(s)  # of days IP prior to Palliative referral  0  Clinical Assessment  Palliative Performance Scale Score  50%  Pain Max last 24 hours  Not able to report  Pain Min Last 24 hours  Not able to report  Dyspnea Max Last 24 Hours  Not able to report  Dyspnea Min Last 24 hours  Not able to report  Psychosocial & Spiritual Assessment  Palliative Care Outcomes      Time In: 1110 Time Out: 1220 Time Total: 70 minutes Greater than 50%  of this time was spent counseling and coordinating care related to the above assessment and plan.  Signed by: Drue Novel, NP   Please contact Palliative Medicine Team phone at [phone] for questions and concerns.  For individual provider: See Shea Evans

## 2016-04-09 NOTE — Consult Note (Signed)
Referring Provider: Dr. Cindie Laroche  Primary Care Physician:  Maricela Curet, MD Primary Gastroenterologist:  Dr. Gala Romney (during hospitalization May 2016)   Date of Admission: 04/08/16 Date of Consultation: 04/09/16  Reason for Consultation:  Elevated Transaminases  HPI:  Alexandra [name] is an 82 y.o. year old female with a history of dementia, presenting to the ED by ambulance from assisted living due to caretakers noting she was less responsive, vomiting, and was found to have significant elevated transaminases with AST 4948 and ALT 2073. Tbili was 3.5, Alk Phos 153. Today, transaminases have improved significantly although still quite elevated. Viral markers pending. Patient is pleasantly confused, stating the year is 19, and she is unable to give a reliable history. The majority of information was obtained from previous documentation in the medical record.   Per ED notes, staff noted a decline in patient and weakness, prompting ED evaluation. H&P notes patient was obtunded and had noticeable grimacing with physical exam, specifically of RUQ. Patient denies any abdominal pain, N/V with me.   Patient was actually seen in May 2016 with similar presentation to include transaminitis, with US abdomen showing multiple gallstones. It was felt at the time of that presentation that she was presenting with ischemic hepatitis in the setting of a viral illness, unable to exclude cholecystitis but a poor surgical candidate.   Pharmacy has been consulted for assistance with need for acetylcysteine, as patient is a poor historian and it is unclear what potential acetaminophen exposure she has had.    Past Medical History:  Diagnosis Date  . Breast cancer ([place])   . Cataract   . Chronic neck and back pain   . Dementia   . Dementia   . Hyperlipidemia   . IBS (irritable bowel syndrome)     Past Surgical History:  Procedure Laterality Date  . MASTECTOMY      Prior to Admission medications    Medication Sig Start Date End Date Taking? Authorizing Provider  acetaminophen (TYLENOL) 650 MG CR tablet Take 650 mg by mouth every 4 (four) hours as needed for pain (mild pain).   Yes Historical Provider, MD  Alum & Mag Hydroxide-Simeth (ANTACID LIQUID PO) Take 30 mLs by mouth 4 (four) times daily as needed. twenty minutes to 1 hour after meals and at bedtime as needed for burning stomach   Yes Historical Provider, MD  anti-nausea (EMETROL) solution Take 15 mLs by mouth every 15 (fifteen) minutes as needed for nausea or vomiting.   Yes Historical Provider, MD  cetirizine (ZYRTEC) 10 MG tablet Take 10 mg by mouth daily.   Yes Historical Provider, MD  Dextromethorphan-Guaifenesin (TUSSIN DM) 10-100 MG/5ML liquid Take 15 mLs by mouth every 4 (four) hours as needed (for simple cough). Do not exceed 4 doses in 24 hours.   Yes Historical Provider, MD  donepezil (ARICEPT) 10 MG tablet Take 1 tablet (10 mg total) by mouth at bedtime. 11/16/14  Yes Lucia Gaskins, MD  hydroxypropyl methylcellulose / hypromellose (ISOPTO TEARS / GONIOVISC) 2.5 % ophthalmic solution 2 drops as needed (for eye lid irritation).   Yes Historical Provider, MD  liver oil-zinc oxide (DESITIN) 40 % ointment Apply 1 application topically as needed (for urine burn).   Yes Historical Provider, MD  Multiple Vitamin (MULTIVITAMIN WITH MINERALS) TABS tablet Take 1 tablet by mouth daily.   Yes Historical Provider, MD  neomycin-bacitracin-polymyxin (NEOSPORIN) ointment Apply 1 application topically as needed for wound care. Wash affected area with soap and warm water first; then dry.  Apply a small amount as needed for cuts or abrasions.   Yes Historical Provider, MD  omeprazole (PRILOSEC) 20 MG capsule Take 20 mg by mouth daily. 10/20/14  Yes Historical Provider, MD  phenol (CHLORASEPTIC) 1.4 % LIQD Use as directed 1 spray in the mouth or throat as needed for throat irritation / pain.   Yes Historical Provider, MD  pravastatin (PRAVACHOL) 40  MG tablet Take 40 mg by mouth daily. 10/20/14  Yes Historical Provider, MD  magnesium hydroxide (MILK OF MAGNESIA) 400 MG/5ML suspension Take 30 mLs by mouth daily as needed for mild constipation.    Historical Provider, MD    Current Facility-Administered Medications  Medication Dose Route Frequency Provider Last Rate Last Dose  . 0.9 %  sodium chloride infusion   Intravenous Continuous Erline Hau, MD 75 mL/hr at 04/09/16 0015    . docusate sodium (COLACE) capsule 100 mg  100 mg Oral BID Erline Hau, MD   100 mg at 04/09/16 1102  . donepezil (ARICEPT) tablet 10 mg  10 mg Oral QHS Erline Hau, MD   10 mg at 04/09/16 0016  . enoxaparin (LOVENOX) injection 30 mg  30 mg Subcutaneous Q24H Lucia Gaskins, MD      . guaiFENesin-dextromethorphan [facility] DM) 100-10 MG/5ML syrup 10 mL  10 mL Oral Q4H PRN Lucia Gaskins, MD   10 mL at 04/09/16 1517  . multivitamin with minerals tablet 1 tablet  1 tablet Oral Daily Erline Hau, MD   1 tablet at 04/09/16 1102  . ondansetron (ZOFRAN) tablet 4 mg  4 mg Oral Q6H PRN Erline Hau, MD       Or  . ondansetron [facility]) injection 4 mg  4 mg Intravenous Q6H PRN Erline Hau, MD      . pantoprazole (PROTONIX) EC tablet 40 mg  40 mg Oral Daily Erline Hau, MD   40 mg at 04/09/16 1102  . polyvinyl alcohol (LIQUIFILM TEARS) 1.4 % ophthalmic solution 2 drop  2 drop Both Eyes PRN Lucia Gaskins, MD        Allergies as of 04/08/2016 - Review Complete 04/08/2016  Allergen Reaction Noted  . Aspirin  09/06/2008  . Chocolate  11/12/2014  . Penicillins  01/15/2007    Family History  Problem Relation Age of Onset  . Colon cancer      unknown    Social History   Social History  . Marital status: Widowed    Spouse name: N/A  . Number of children: N/A  . Years of education: N/A   Occupational History  . Not on file.   Social History Main Topics  . Smoking  status: Former Research scientist (life sciences)  . Smokeless tobacco: Not on file  . Alcohol use No     Comment: in the past per patient  . Drug use: No  . Sexual activity: Not on file   Other Topics Concern  . Not on file   Social History Narrative  . No narrative on file    Review of Systems: Unable to obtain due to cognitive status  Physical Exam: Vital signs in last 24 hours: Temp:  [97.3 F (36.3 C)-97.8 F (36.6 C)] 97.8 F (36.6 C) (09/26 1419) Pulse Rate:  [58-93] 58 (09/26 1419) Resp:  [18-24] 20 (09/26 1419) BP: (114-150)/(45-107) 114/45 (09/26 1419) SpO2:  [95 %-100 %] 98 % (09/26 1419) Weight:  [80 lb 4.8 oz (36.4 kg)] 80 lb 4.8 oz (  36.4 kg) (09/25 2002)   General:   Alert,  Cachectic appearing, frail  Head:  Normocephalic and atraumatic. Eyes:  Mild scleral icterus  Ears:  Mild hard of hearing  Nose:  No deformity, discharge,  or lesions. Mouth:  Oral mucosa moist  Lungs:  Clear throughout to auscultation.    Heart:  Regular rate and rhythm Abdomen:  Soft, nontender and nondistended. No masses, hepatosplenomegaly or hernias noted. Normal bowel sounds, without guarding, and without rebound.   Rectal:  Deferred  Msk:  Kyphosis  Extremities:  Without  edema. Neurologic:  Alert to person and city only  Psych:  Alert and cooperative.   Intake/Output from previous day: 09/25 0701 - 09/26 0700 In: 1000 [IV Piggyback:1000] Out: -  Intake/Output this shift: Total I/O In: 741.3 [P.O.:120; I.V.:621.3] Out: -   Lab Results:  Recent Labs  04/08/16 1410 04/09/16 0539 04/09/16 1400  WBC 10.4 8.2 7.6  HGB 12.9 11.2* 11.2*  HCT 37.9 32.6* 32.4*  PLT 115* 95* 94*   BMET  Recent Labs  04/08/16 1410 04/09/16 0539  NA 138 140  K 3.6 3.6  CL 105 108  CO2 27 28  GLUCOSE 105* 74  BUN 16 13  CREATININE 0.83 0.60  CALCIUM 9.5 8.6*   LFT  Recent Labs  04/08/16 1410 04/09/16 0539  PROT 6.9 5.8*  ALBUMIN 4.0 3.3*  AST 4,948* 1,438*  ALT 2,073* 1,135*  ALKPHOS 153* 122   BILITOT 3.5* 3.5*  BILIDIR 2.3*  --   IBILI 1.2*  --     Studies/Results: Dg Chest 2 View  Result Date: 04/08/2016 CLINICAL DATA:  Weakness, history of breast cancer, dementia EXAM: CHEST  2 VIEW COMPARISON:  11/11/2014 FINDINGS: The heart size and mediastinal contours are within normal limits. Both lungs are clear. The visualized skeletal structures are unremarkable. IMPRESSION: No active cardiopulmonary disease. Electronically Signed   By: Kathreen Devoid   On: 04/08/2016 15:11   Ct Head Wo Contrast  Result Date: 04/08/2016 CLINICAL DATA:  Altered level of consciousness.  Dementia. EXAM: CT HEAD WITHOUT CONTRAST TECHNIQUE: Contiguous axial images were obtained from the base of the skull through the vertex without intravenous contrast. COMPARISON:  07/11/2015 FINDINGS: Brain: Stable cerebral atrophy with stable enlargement of the ventricles. Stable low-density in the periventricular white matter. Negative for acute hemorrhage, mass lesion, midline shift or large infarct. Vascular: No hyperdense vessel or unexpected calcification. Skull: Normal. Negative for fracture or focal lesion. Sinuses/Orbits: No acute finding. Other: None. IMPRESSION: No acute intracranial abnormality. Stable atrophy and ventricular dilatation. Electronically Signed   By: Markus Daft M.D.   On: 04/08/2016 15:19   Ct Abdomen Pelvis W Contrast  Result Date: 04/08/2016 CLINICAL DATA:  Right upper quadrant pain EXAM: CT ABDOMEN AND PELVIS WITH CONTRAST TECHNIQUE: Multidetector CT imaging of the abdomen and pelvis was performed using the standard protocol following bolus administration of intravenous contrast. CONTRAST:  143m ISOVUE-300 IOPAMIDOL (ISOVUE-300) INJECTION 61% COMPARISON:  None. FINDINGS: Lower chest: No pleural or pericardial effusion. Subpleural consolidation within the right base is noted, image 5 of series 6. Hepatobiliary: No focal liver abnormality. Multiple stones are identified within the gallbladder. There is  mild pericholecystic fluid noted. Pancreas: Unremarkable. No pancreatic ductal dilatation or surrounding inflammatory changes. Spleen: Normal in size without focal abnormality. Adrenals/Urinary Tract: Adrenal glands are unremarkable. There is a cyst within the inferior pole of the right kidney noted CED measuring 3.2 cm, image 24 of series 2. Left kidney is normal, without renal  calculi, focal lesion, or hydronephrosis. Bladder is unremarkable. Stomach/Bowel: The stomach is within normal limits. The small bowel loops have a normal course and caliber. No obstruction. Normal appearance of the colon. Numerous distal colonic diverticula identified. Mild wall thickening involving the sigmoid colon is identified. Vascular/Lymphatic: Calcified atherosclerotic disease involves the abdominal aorta. No aneurysm. No enlarged retroperitoneal or mesenteric adenopathy. No enlarged pelvic or inguinal lymph nodes. Reproductive: The uterus is not visualized and may be surgically absent or atrophic. No adnexal masses. Other: There is a small amount of free fluid noted within the dependent portion of the pelvis. Musculoskeletal: Degenerative disc disease identified within the lumbar spine. IMPRESSION: 1. Gallstones and mild pericholecystic fluid. If there is a concern for acute cholecystitis consider further investigation with abdominal sonogram. 2. Small amount of free fluid noted within the dependent portion of the pelvis. 3. Sigmoid diverticulosis. Mild wall thickening involving the sigmoid colon is noted. Correlation for any clinical signs or symptoms of cholecystitis. Electronically Signed   By: Kerby Moors M.D.   On: 04/08/2016 21:02   US Abdomen Limited Ruq  Result Date: 04/09/2016 CLINICAL DATA:  Elevated liver function studies. History of kidney stones. EXAM: US ABDOMEN LIMITED - RIGHT UPPER QUADRANT COMPARISON:  Renal ultrasound of Nov 14, 2014 FINDINGS: Gallbladder: There are multiple echogenic mobile shadowing  stones. There is mild gallbladder wall thickening to 4 mm. There is no pericholecystic fluid or positive sonographic Murphy's sign. Common bile duct: Diameter: 2.6 mm Liver: The hepatic echotexture is slightly decreased overall with prominence of the portal triads. There is no focal mass or ductal dilation. IMPRESSION: Multiple mobile gallstones with mild chronic thickening of the gallbladder wall to 4 mm. No positive sonographic Murphy's sign. Mildly decreased hepatic echotexture chronically without discrete mass or ductal dilation. Electronically Signed   By: David  Martinique M.D.   On: 04/09/2016 09:12    Impression: 82 year old female admitted with markedly elevated transaminases, similar to presentation in May 2016, with viral markers pending. Pharmacy consultation appreciated; it appears that patient has not received any acetaminophen in the last month. LFTs have already begun to improve significantly with supportive measures, raising suspicion for possible ischemic hepatitis etiology. Less likely biliary. Highly doubt viral hepatitis: viral markers were negative last year. Would follow LFTs closely and if further bump would draw further serologies. Check PT/INR and acetaminophen level now.   Plan: PT/INR, acetaminophen level now HFP in am Further serologies if LFTs do not continue to improve Advance diet to dysphagia level 3 Will continue to follow with you  Annitta Needs, ANP-BC [facility]      LOS: 1 day    04/09/2016, 3:43 PM

## 2016-04-10 ENCOUNTER — Encounter (HOSPITAL_COMMUNITY): Payer: Self-pay | Admitting: Emergency Medicine

## 2016-04-10 LAB — BASIC METABOLIC PANEL
ANION GAP: 3 — AB (ref 5–15)
BUN: 13 mg/dL (ref 6–20)
CALCIUM: 8.4 mg/dL — AB (ref 8.9–10.3)
CO2: 28 mmol/L (ref 22–32)
CREATININE: 0.61 mg/dL (ref 0.44–1.00)
Chloride: 110 mmol/L (ref 101–111)
GFR calc Af Amer: >60 mL/min (ref >60)
GFR calc non Af Amer: >60 mL/min (ref >60)
GLUCOSE: 90 mg/dL (ref 65–99)
Potassium: 3.5 mmol/L (ref 3.5–5.1)
Sodium: 141 mmol/L (ref 135–145)

## 2016-04-10 LAB — PROTIME-INR
INR: 1.33
Prothrombin Time: 16.6 seconds — ABNORMAL HIGH (ref 11.4–15.2)

## 2016-04-10 LAB — HEPATIC FUNCTION PANEL
ALT: 700 U/L — ABNORMAL HIGH (ref 14–54)
AST: 568 U/L — AB (ref 15–41)
Albumin: 3.3 g/dL — ABNORMAL LOW (ref 3.5–5.0)
Alkaline Phosphatase: 130 U/L — ABNORMAL HIGH (ref 38–126)
BILIRUBIN DIRECT: 0.6 mg/dL — AB (ref 0.1–0.5)
BILIRUBIN INDIRECT: 0.8 mg/dL (ref 0.3–0.9)
BILIRUBIN TOTAL: 1.4 mg/dL — AB (ref 0.3–1.2)
Total Protein: 5.9 g/dL — ABNORMAL LOW (ref 6.5–8.1)

## 2016-04-10 LAB — FOLATE: Folate: 67.4 ng/mL (ref >5.9)

## 2016-04-10 LAB — HEPATITIS PANEL, ACUTE
HEP A IGM: NEGATIVE
HEP B C IGM: NEGATIVE
HEP B S AG: NEGATIVE

## 2016-04-10 LAB — RPR: RPR: NONREACTIVE

## 2016-04-10 MED ORDER — DEXTROSE 5 % IV SOLN
2.0000 g | INTRAVENOUS | Status: DC
  Administered 2016-04-10 – 2016-04-11 (×2): 2 g via INTRAVENOUS
  Filled 2016-04-10 (×6): qty 2

## 2016-04-10 NOTE — Care Management Note (Addendum)
Case Management Note  Patient Details  Name: Alexandra [name] MRN: [id number] Date of Birth: 08/15/1925  Subjective/Objective:                  Pt admitted with transaminases. She is from ALF. Per CSW the plan is for pt to return to ALF at DC. CSW will make arrangements for return to facility.   Action/Plan: Will cont to follow. Pt will need PT eval closer to being DC'd.   Expected Discharge Date:    04/11/2016              Expected Discharge Plan:  Against Medical Advice  In-House Referral:  Clinical Social Work  Discharge planning Services  CM Consult  Post Acute Care Choice:  NA Choice offered to:  NA  DME Arranged:    DME Agency:     HH Arranged:    [place] Agency:     Status of Service:  Continue to follow.   If discussed at [place] of Stay Meetings, dates discussed:    Additional Comments:  Sherald Barge, RN 04/10/2016, 12:42 PM

## 2016-04-10 NOTE — Care Management Important Message (Signed)
Important Message  Patient Details  Name: Alexandra [name] MRN: [id number] Date of Birth: September 16, 1925   Medicare Important Message Given:  Yes    Sherald Barge, RN 04/10/2016, 10:38 AM

## 2016-04-10 NOTE — Progress Notes (Signed)
Daily Progress Note   Patient Name: Alexandra [name]       Date: 04/10/2016 DOB: March 30, 1926  Age: 82 y.o. MRN#: [id number] Attending Physician: Alexandra Gaskins, MD Primary Care Physician: Alexandra Curet, MD Admit Date: 04/08/2016  Reason for Consultation/Follow-up: Disposition, Establishing goals of care and Psychosocial/spiritual support  Subjective: Alexandra [name] is resting quietly in bed, eating lunch. She smiles, making eye contact with me as I enter. She has a Actuary at bedside today. She denies pain or concerns. I share that I'm glad she's here with [place]. Alexandra [name] states she is glad also, she has been "sick lately".   Call to son Alexandra [name].  I share her improving labs, continued treatment for UTI, and disposition [facility] is medically ready, in the next day or 2 likely.  Call to [employer], [employer]. I share that son, Alexandra [name], is requesting audiology consult for Alexandra [name].  We also talk about her current health status, and Alexandra [name]'s decision to elect DNR (allow a natural death).  Length of Stay: 2  Current Medications: Scheduled Meds:  . cefTRIAXone (ROCEPHIN)  IV  2 g Intravenous Q24H  . docusate sodium  100 mg Oral BID  . donepezil  10 mg Oral QHS  . enoxaparin (LOVENOX) injection  30 mg Subcutaneous Q24H  . multivitamin with minerals  1 tablet Oral Daily  . pantoprazole  40 mg Oral Daily    Continuous Infusions: . sodium chloride 75 mL/hr at 04/10/16 1057    PRN Meds: guaiFENesin-dextromethorphan, ondansetron **OR** ondansetron (ZOFRAN) IV, polyvinyl alcohol  Physical Exam  Constitutional: No distress.  Frail and thin, chronically ill appearing, some temporal wasting.  HENT:  Head: Normocephalic and atraumatic.  Cardiovascular: Normal rate  and regular rhythm.   Pulmonary/Chest: Effort normal. No respiratory distress.  Abdominal: Soft. She exhibits no distension. There is no guarding.  Neurological: She is alert.  Oriented to person, pleasantly confused, demented  Skin: Skin is warm and dry.  Nursing note and vitals reviewed.           Vital Signs: BP 127/62 (BP Location: Right Arm)   Pulse 67   Temp 98.3 F (36.8 C) (Oral)   Resp 20   Wt 36.4 kg (80 lb 4.8 oz)   SpO2 100%   BMI 14.22  kg/m  SpO2: SpO2: 100 % O2 Device: O2 Device: Not Delivered O2 Flow Rate:    Intake/output summary:  Intake/Output Summary (Last 24 hours) at 04/10/16 1415 Last data filed at 04/10/16 1219  Gross per 24 hour  Intake           2817.5 ml  Output                0 ml  Net           2817.5 ml   LBM: Last BM Date: 04/08/16 Baseline Weight: Weight: 42.2 kg (93 lb) Most recent weight: Weight: 36.4 kg (80 lb 4.8 oz)       Palliative Assessment/Data:    Flowsheet Rows   Flowsheet Row Most Recent Value  Intake Tab  Referral Department  Hospitalist  Unit at Time of Referral  Med/Surg Unit  Palliative Care Primary Diagnosis  Other (Comment)  Date Notified  04/08/16  Palliative Care Type  New Palliative care  Reason for referral  Clarify Goals of Care  Date of Admission  04/08/16  Date first seen by Palliative Care  04/09/16  # of days Palliative referral response time  1 Day(s)  # of days IP prior to Palliative referral  0  Clinical Assessment  Palliative Performance Scale Score  50%  Pain Max last 24 hours  Not able to report  Pain Min Last 24 hours  Not able to report  Dyspnea Max Last 24 Hours  Not able to report  Dyspnea Min Last 24 hours  Not able to report  Psychosocial & Spiritual Assessment  Palliative Care Outcomes  Patient/Family meeting held?  Yes  Who was at the meeting?  With son Alexandra [name] via phone  Palliative Care Outcomes  Provided advance care planning, Completed durable DNR, Changed CPR status, Clarified  goals of care  Patient/Family wishes: Interventions discontinued/not started   Mechanical Ventilation  Palliative Care follow-up planned  -- [Follow-up while at [facility]]      Patient Active Problem List   Diagnosis Date Noted  . Palliative care encounter   . Goals of care, counseling/discussion   . DNR (do not resuscitate) discussion   . Transaminitis 04/08/2016  . Fall at home 07/12/2015  . Dementia 07/12/2015  . Dyslipidemia 07/12/2015  . RBBB-no old EKGs 07/12/2015  . Bradycardia-HR 52 on adm EKG 07/12/2015  . Elevated troponin 07/11/2015  . NSTEMI (non-ST elevated myocardial infarction) ([place]) 07/11/2015  . Elevated LFTs   . Altered mental status 11/12/2014  . Gastroenteritis 11/12/2014  . Fever 11/12/2014  . LEUKOPENIA, MILD 04/04/2009  . TRANSAMINASES, SERUM, ELEVATED 04/04/2009  . GRIEF REACTION 09/06/2008  . DECREASED HEARING 02/11/2008  . SPINAL STENOSIS 02/18/2007  . History of breast cancer 01/15/2007  . ANXIETY 01/15/2007  . PERIPHERAL NEUROPATHY 01/15/2007  . CATARACT NOS 01/15/2007  . ALLERGIC RHINITIS 01/15/2007  . ASTHMA 01/15/2007  . GERD 01/15/2007  . CONSTIPATION 01/15/2007  . IBS 01/15/2007  . ARTHRITIS 01/15/2007    Palliative Care Assessment & Plan   Patient Profile: 82 y.o. female  with past medical history of Dementia, hyperlipidemia, IBS with constipation, breast cancer with mastectomy, chronic neck and back pain, spinal stenosis admitted on 04/08/2016 with transaminitis.   Assessment: As above  Recommendations/Plan:  continue to treat the treatable, [facility]. Family requesting audiology consult.  Goals of Care and Additional Recommendations:  Limitations on Scope of Treatment: As above  Code Status:    Code Status Orders  Start     Ordered   04/09/16 1248  Do not attempt resuscitation (DNR)  Continuous    Question Answer Comment  In the event of cardiac or respiratory ARREST Do not call a  "code blue"   In the event of cardiac or respiratory ARREST Do not perform Intubation, CPR, defibrillation or ACLS   In the event of cardiac or respiratory ARREST Use medication by any route, position, wound care, and other measures to relive pain and suffering. May use oxygen, suction and manual treatment of airway obstruction as needed for comfort.      04/09/16 1248    Code Status History    Date Active Date Inactive Code Status Order ID Comments User Context   04/08/2016  6:31 PM 04/09/2016 12:48 PM Full Code [id number]  Erline Hau, MD Inpatient   07/11/2015 11:09 PM 07/14/2015  6:42 PM Full Code [id number]  Merton Border, MD Inpatient   11/12/2014  2:48 AM 11/16/2014  3:28 PM Full Code [id number]  Orvan Falconer, MD Inpatient       Prognosis:   Unable to determine,  based on outcomes. Possibly 6 months or less related to frailty, weight loss, dementia.   Discharge Planning:  Return to [facility] group [facility] when able.  Care plan was discussed with nursing staff, case manager, social worker, and Dr. Lorriane Shire on next rounds.  Thank you for allowing the Palliative Medicine Team to assist in the care of this patient.   Time In: 1045 Time Out: 1115 Total Time 30 minutes  Prolonged Time Billed  no       Greater than 50%  of this time was spent counseling and coordinating care related to the above assessment and plan.  Drue Novel, NP  Please contact Palliative Medicine Team phone at [phone] for questions and concerns.

## 2016-04-10 NOTE — Progress Notes (Addendum)
Pt bladder scanned at 1100. Large amount of urine noted in bladder. Immediately after pt voided. RN bladder scanned patient again at 1619.  153 cc noted. Will continue to encourage voiding and get pt up to [facility]. MD called twice. No answer. RN left message about situation.  Oswald Hillock, RN

## 2016-04-10 NOTE — Clinical Social Work Note (Signed)
Clinical Social Work Assessment  Patient Details  Name: Alexandra [name] MRN: [id number] Date of Birth: 08/10/1925  Date of referral:  04/10/16               Reason for consult:  Other (Comment Required) (From [facility])                Permission sought to share information with:    Permission granted to share information::     Name::        Agency::     Relationship::     Contact Information:  Son, Iona Beard, listed on chart.    Housing/Transportation Living arrangements for the past 2 months:   ([place]) Source of Information:  Facility, Adult Children Patient Interpreter Needed:  None Criminal Activity/Legal Involvement Pertinent to Current Situation/Hospitalization:  No - Comment as needed Significant Relationships:  Adult Children Lives with:  Facility Resident Do you feel safe going back to the place where you live?  Yes Need for family participation in patient care:  Yes (Comment)  Care giving concerns:  None identified. Facility resident.    Social Worker assessment / plan:  Per Napoleon Form, facility administrator, patient has been a resident for the past seven years.  Patient "gets around on her own when she feels like it." Patient feeds herself, completes her bathing with assistance and is able to use the bathroom independently.  He stated that patient can return to the facility at discharge. Son, Briannia Coers, confirmed statements.  He stated that patient can return to the facility at discharge.   Employment status:  Retired Nurse, adult PT Recommendations:  Not assessed at this time Information / Referral to community resources:     Patient/Family's Response to care:  Family is agreeable to return to facility at discharge.   Patient/Family's Understanding of and Emotional Response to Diagnosis, Current Treatment, and Prognosis:  Family understands patients diagnosis, treatment and prognosis.   Emotional Assessment Appearance:   Appears stated age Attitude/Demeanor/Rapport:  Unable to Assess Affect (typically observed):  Unable to Assess Orientation:  Oriented to Self Alcohol / Substance use:  Not Applicable Psych involvement (Current and /or in the community):  No (Comment)  Discharge Needs  Concerns to be addressed:  Discharge Planning Concerns (Return to [facility]) Readmission within the last 30 days:  No Current discharge risk:  None Barriers to Discharge:  No Barriers Identified   Ihor Gully, LCSW 04/10/2016, 11:20 AM

## 2016-04-10 NOTE — Progress Notes (Signed)
Subjective:  Pleasant but confused. Eating breakfast with assistance.  Objective: Vital signs in last 24 hours: Temp:  [97.5 F (36.4 C)-98.6 F (37 C)] 98.6 F (37 C) (09/27 0443) Pulse Rate:  [58-63] 60 (09/27 0443) Resp:  [20] 20 (09/27 0443) BP: (114-121)/(45-58) 121/54 (09/27 0443) SpO2:  [97 %-100 %] 100 % (09/27 0443)   General:   Alert,   pleasant and cooperative in NAD Head:  Normocephalic and atraumatic. Eyes:  Sclera clear, no icterus.  Abdomen:  Soft, nontender and nondistended.  Extremities:  Without clubbing, deformity or edema. Neurologic:  Alert and  oriented to person. Psych:  Alert and cooperative.  Intake/Output from previous day: 09/26 0701 - 09/27 0700 In: 981.3 [P.O.:360; I.V.:621.3] Out: -  Intake/Output this shift: No intake/output data recorded.  Lab Results: CBC  Recent Labs  04/08/16 1410 04/09/16 0539 04/09/16 1400  WBC 10.4 8.2 7.6  HGB 12.9 11.2* 11.2*  HCT 37.9 32.6* 32.4*  MCV 96.9 96.4 96.1  PLT 115* 95* 94*   BMET  Recent Labs  04/08/16 1410 04/09/16 0539 04/10/16 0542  NA 138 140 141  K 3.6 3.6 3.5  CL 105 108 110  CO2 27 28 28   GLUCOSE 105* 74 90  BUN 16 13 13   CREATININE 0.83 0.60 0.61  CALCIUM 9.5 8.6* 8.4*   LFTs  Recent Labs  04/08/16 1410 04/09/16 0539 04/10/16 0542  BILITOT 3.5* 3.5* 1.4*  BILIDIR 2.3*  --  0.6*  IBILI 1.2*  --  0.8  ALKPHOS 153* 122 130*  AST 4,948* 1,438* 568*  ALT 2,073* 1,135* 700*  PROT 6.9 5.8* 5.9*  ALBUMIN 4.0 3.3* 3.3*   No results for input(s): LIPASE in the last 72 hours. PT/INR  Recent Labs  04/09/16 1556 04/10/16 0542  LABPROT 17.8* 16.6*  INR 1.45 1.33      Imaging Studies: Dg Chest 2 View  Result Date: 04/08/2016 CLINICAL DATA:  Weakness, history of breast cancer, dementia EXAM: CHEST  2 VIEW COMPARISON:  11/11/2014 FINDINGS: The heart size and mediastinal contours are within normal limits. Both lungs are clear. The visualized skeletal structures are  unremarkable. IMPRESSION: No active cardiopulmonary disease. Electronically Signed   By: Kathreen Devoid   On: 04/08/2016 15:11   Ct Head Wo Contrast  Result Date: 04/08/2016 CLINICAL DATA:  Altered level of consciousness.  Dementia. EXAM: CT HEAD WITHOUT CONTRAST TECHNIQUE: Contiguous axial images were obtained from the base of the skull through the vertex without intravenous contrast. COMPARISON:  07/11/2015 FINDINGS: Brain: Stable cerebral atrophy with stable enlargement of the ventricles. Stable low-density in the periventricular white matter. Negative for acute hemorrhage, mass lesion, midline shift or large infarct. Vascular: No hyperdense vessel or unexpected calcification. Skull: Normal. Negative for fracture or focal lesion. Sinuses/Orbits: No acute finding. Other: None. IMPRESSION: No acute intracranial abnormality. Stable atrophy and ventricular dilatation. Electronically Signed   By: Markus Daft M.D.   On: 04/08/2016 15:19   Ct Abdomen Pelvis W Contrast  Result Date: 04/08/2016 CLINICAL DATA:  Right upper quadrant pain EXAM: CT ABDOMEN AND PELVIS WITH CONTRAST TECHNIQUE: Multidetector CT imaging of the abdomen and pelvis was performed using the standard protocol following bolus administration of intravenous contrast. CONTRAST:  125mL ISOVUE-300 IOPAMIDOL (ISOVUE-300) INJECTION 61% COMPARISON:  None. FINDINGS: Lower chest: No pleural or pericardial effusion. Subpleural consolidation within the right base is noted, image 5 of series 6. Hepatobiliary: No focal liver abnormality. Multiple stones are identified within the gallbladder. There is mild pericholecystic fluid noted.  Pancreas: Unremarkable. No pancreatic ductal dilatation or surrounding inflammatory changes. Spleen: Normal in size without focal abnormality. Adrenals/Urinary Tract: Adrenal glands are unremarkable. There is a cyst within the inferior pole of the right kidney noted CED measuring 3.2 cm, image 24 of series 2. Left kidney is normal,  without renal calculi, focal lesion, or hydronephrosis. Bladder is unremarkable. Stomach/Bowel: The stomach is within normal limits. The small bowel loops have a normal course and caliber. No obstruction. Normal appearance of the colon. Numerous distal colonic diverticula identified. Mild wall thickening involving the sigmoid colon is identified. Vascular/Lymphatic: Calcified atherosclerotic disease involves the abdominal aorta. No aneurysm. No enlarged retroperitoneal or mesenteric adenopathy. No enlarged pelvic or inguinal lymph nodes. Reproductive: The uterus is not visualized and may be surgically absent or atrophic. No adnexal masses. Other: There is a small amount of free fluid noted within the dependent portion of the pelvis. Musculoskeletal: Degenerative disc disease identified within the lumbar spine. IMPRESSION: 1. Gallstones and mild pericholecystic fluid. If there is a concern for acute cholecystitis consider further investigation with abdominal sonogram. 2. Small amount of free fluid noted within the dependent portion of the pelvis. 3. Sigmoid diverticulosis. Mild wall thickening involving the sigmoid colon is noted. Correlation for any clinical signs or symptoms of cholecystitis. Electronically Signed   By: Kerby Moors M.D.   On: 04/08/2016 21:02   US Abdomen Limited Ruq  Result Date: 04/09/2016 CLINICAL DATA:  Elevated liver function studies. History of kidney stones. EXAM: US ABDOMEN LIMITED - RIGHT UPPER QUADRANT COMPARISON:  Renal ultrasound of Nov 14, 2014 FINDINGS: Gallbladder: There are multiple echogenic mobile shadowing stones. There is mild gallbladder wall thickening to 4 mm. There is no pericholecystic fluid or positive sonographic Murphy's sign. Common bile duct: Diameter: 2.6 mm Liver: The hepatic echotexture is slightly decreased overall with prominence of the portal triads. There is no focal mass or ductal dilation. IMPRESSION: Multiple mobile gallstones with mild chronic  thickening of the gallbladder wall to 4 mm. No positive sonographic Murphy's sign. Mildly decreased hepatic echotexture chronically without discrete mass or ductal dilation. Electronically Signed   By: David  Martinique M.D.   On: 04/09/2016 09:12  [2 weeks]   Assessment: 82 year old female admitted with markedly elevated transaminases likely due to ischemic hepatitis. Etiology for decline prior to admission ie lethargy, vomiting, likely hypotension has not been well defined. U/A abnormal. ?underlying urinary tract infection. LFTs continue to improve. She has h/o gallstones with chronic gb wall thickening on u/s. Current situation not felt to be related to biliary etiology. PT/INR improved as well. Also with mild wall thickening of sigmoid colon without signs of diverticulitis.    Plan: 1. Continue to follow LFTs to baseline. 2. Consider check urine culture and/or empiric treatment. To be managed per attending.  3. Will follow peripherally.  Laureen Ochs. Bernarda Caffey [facility] [phone] 9/27/20179:04 AM     LOS: 2 days

## 2016-04-10 NOTE — Progress Notes (Signed)
RN communicated to night shift RN to continuous encourage voiding and placing pt on [facility] or bladder scan if any issues arise. Oswald Hillock, RN

## 2016-04-10 NOTE — Progress Notes (Signed)
LFTs continuing to improve dramatically patient eating well at baseline mental status Alexandra [name] [id number] DOB: 12-Aug-1925 DOA: 04/08/2016 PCP: Maricela Curet, MD   Physical Exam: Blood pressure (!) 121/54, pulse 60, temperature 98.6 F (37 C), temperature source Oral, resp. rate 20, weight 36.4 kg (80 lb 4.8 oz), SpO2 100 %. Lungs clear to A&P no rales wheeze rhonchi heart regular rhythm no murmurs gallops heaves thrills or rubs no right upper quadrant tenderness bowel sounds normoactive no guarding or rebound or masses no megaly   Investigations:  No results found for this or any previous visit (from the past 240 hour(s)).   Basic Metabolic Panel:  Recent Labs  04/09/16 0539 04/10/16 0542  NA 140 141  K 3.6 3.5  CL 108 110  CO2 28 28  GLUCOSE 74 90  BUN 13 13  CREATININE 0.60 0.61  CALCIUM 8.6* 8.4*   Liver Function Tests:  Recent Labs  04/09/16 0539 04/10/16 0542  AST 1,438* 568*  ALT 1,135* 700*  ALKPHOS 122 130*  BILITOT 3.5* 1.4*  PROT 5.8* 5.9*  ALBUMIN 3.3* 3.3*     CBC:  Recent Labs  04/08/16 1410 04/09/16 0539 04/09/16 1400  WBC 10.4 8.2 7.6  NEUTROABS 9.0*  --   --   HGB 12.9 11.2* 11.2*  HCT 37.9 32.6* 32.4*  MCV 96.9 96.4 96.1  PLT 115* 95* 94*    Dg Chest 2 View  Result Date: 04/08/2016 CLINICAL DATA:  Weakness, history of breast cancer, dementia EXAM: CHEST  2 VIEW COMPARISON:  11/11/2014 FINDINGS: The heart size and mediastinal contours are within normal limits. Both lungs are clear. The visualized skeletal structures are unremarkable. IMPRESSION: No active cardiopulmonary disease. Electronically Signed   By: Kathreen Devoid   On: 04/08/2016 15:11   Ct Head Wo Contrast  Result Date: 04/08/2016 CLINICAL DATA:  Altered level of consciousness.  Dementia. EXAM: CT HEAD WITHOUT CONTRAST TECHNIQUE: Contiguous axial images were obtained from the base of the skull through the vertex without intravenous contrast. COMPARISON:  07/11/2015  FINDINGS: Brain: Stable cerebral atrophy with stable enlargement of the ventricles. Stable low-density in the periventricular white matter. Negative for acute hemorrhage, mass lesion, midline shift or large infarct. Vascular: No hyperdense vessel or unexpected calcification. Skull: Normal. Negative for fracture or focal lesion. Sinuses/Orbits: No acute finding. Other: None. IMPRESSION: No acute intracranial abnormality. Stable atrophy and ventricular dilatation. Electronically Signed   By: Markus Daft M.D.   On: 04/08/2016 15:19   Ct Abdomen Pelvis W Contrast  Result Date: 04/08/2016 CLINICAL DATA:  Right upper quadrant pain EXAM: CT ABDOMEN AND PELVIS WITH CONTRAST TECHNIQUE: Multidetector CT imaging of the abdomen and pelvis was performed using the standard protocol following bolus administration of intravenous contrast. CONTRAST:  175mL ISOVUE-300 IOPAMIDOL (ISOVUE-300) INJECTION 61% COMPARISON:  None. FINDINGS: Lower chest: No pleural or pericardial effusion. Subpleural consolidation within the right base is noted, image 5 of series 6. Hepatobiliary: No focal liver abnormality. Multiple stones are identified within the gallbladder. There is mild pericholecystic fluid noted. Pancreas: Unremarkable. No pancreatic ductal dilatation or surrounding inflammatory changes. Spleen: Normal in size without focal abnormality. Adrenals/Urinary Tract: Adrenal glands are unremarkable. There is a cyst within the inferior pole of the right kidney noted CED measuring 3.2 cm, image 24 of series 2. Left kidney is normal, without renal calculi, focal lesion, or hydronephrosis. Bladder is unremarkable. Stomach/Bowel: The stomach is within normal limits. The small bowel loops have a normal course and caliber. No obstruction. Normal  appearance of the colon. Numerous distal colonic diverticula identified. Mild wall thickening involving the sigmoid colon is identified. Vascular/Lymphatic: Calcified atherosclerotic disease involves the  abdominal aorta. No aneurysm. No enlarged retroperitoneal or mesenteric adenopathy. No enlarged pelvic or inguinal lymph nodes. Reproductive: The uterus is not visualized and may be surgically absent or atrophic. No adnexal masses. Other: There is a small amount of free fluid noted within the dependent portion of the pelvis. Musculoskeletal: Degenerative disc disease identified within the lumbar spine. IMPRESSION: 1. Gallstones and mild pericholecystic fluid. If there is a concern for acute cholecystitis consider further investigation with abdominal sonogram. 2. Small amount of free fluid noted within the dependent portion of the pelvis. 3. Sigmoid diverticulosis. Mild wall thickening involving the sigmoid colon is noted. Correlation for any clinical signs or symptoms of cholecystitis. Electronically Signed   By: Kerby Moors M.D.   On: 04/08/2016 21:02   US Abdomen Limited Ruq  Result Date: 04/09/2016 CLINICAL DATA:  Elevated liver function studies. History of kidney stones. EXAM: US ABDOMEN LIMITED - RIGHT UPPER QUADRANT COMPARISON:  Renal ultrasound of Nov 14, 2014 FINDINGS: Gallbladder: There are multiple echogenic mobile shadowing stones. There is mild gallbladder wall thickening to 4 mm. There is no pericholecystic fluid or positive sonographic Murphy's sign. Common bile duct: Diameter: 2.6 mm Liver: The hepatic echotexture is slightly decreased overall with prominence of the portal triads. There is no focal mass or ductal dilation. IMPRESSION: Multiple mobile gallstones with mild chronic thickening of the gallbladder wall to 4 mm. No positive sonographic Murphy's sign. Mildly decreased hepatic echotexture chronically without discrete mass or ductal dilation. Electronically Signed   By: David  Martinique M.D.   On: 04/09/2016 09:12      Medications:   Impression:  Principal Problem:   Transaminitis Active Problems:   Dementia   Palliative care encounter   Goals of care, counseling/discussion    DNR (do not resuscitate) discussion     Plan: Rocephin IV for bacteremia monitor liver function tests in a.m. Consultants: Gastroenterology   Procedures   Antibiotics: Rocephin           Time spent: 30 minutes   LOS: 2 days   Naijah Lacek M   04/10/2016, 1:27 PM

## 2016-04-11 ENCOUNTER — Encounter (HOSPITAL_COMMUNITY): Payer: Self-pay

## 2016-04-11 LAB — BASIC METABOLIC PANEL
ANION GAP: 4 — AB (ref 5–15)
BUN: 8 mg/dL (ref 6–20)
CALCIUM: 8.3 mg/dL — AB (ref 8.9–10.3)
CO2: 28 mmol/L (ref 22–32)
Chloride: 109 mmol/L (ref 101–111)
Creatinine, Ser: 0.48 mg/dL (ref 0.44–1.00)
GLUCOSE: 81 mg/dL (ref 65–99)
POTASSIUM: 3 mmol/L — AB (ref 3.5–5.1)
Sodium: 141 mmol/L (ref 135–145)

## 2016-04-11 LAB — HEPATIC FUNCTION PANEL
ALBUMIN: 2.9 g/dL — AB (ref 3.5–5.0)
ALT: 430 U/L — AB (ref 14–54)
AST: 226 U/L — AB (ref 15–41)
Alkaline Phosphatase: 108 U/L (ref 38–126)
BILIRUBIN DIRECT: 0.3 mg/dL (ref 0.1–0.5)
Indirect Bilirubin: 0.8 mg/dL (ref 0.3–0.9)
TOTAL PROTEIN: 5.3 g/dL — AB (ref 6.5–8.1)
Total Bilirubin: 1.1 mg/dL (ref 0.3–1.2)

## 2016-04-11 NOTE — Progress Notes (Signed)
Transaminases continuing to improve we'll check tomorrow and consider discharge serum ammonia level XI and tolerating food well Alexandra [name] [id number] DOB: Oct 18, 1925 DOA: 04/08/2016 PCP: Maricela Curet, MD   Physical Exam: Blood pressure 128/66, pulse 77, temperature 98.4 F (36.9 C), temperature source Oral, resp. rate 18, weight 36.4 kg (80 lb 4.8 oz), SpO2 95 %. Lungs clear to A&P no rales wheeze rhonchi heart regular rhythm no murmurs goes heaves thrills rubs abdomen soft nontender bowel sounds normoactive no guarding or rebound or masses no megaly   Investigations:  No results found for this or any previous visit (from the past 240 hour(s)).   Basic Metabolic Panel:  Recent Labs  04/10/16 0542 04/11/16 0533  NA 141 141  K 3.5 3.0*  CL 110 109  CO2 28 28  GLUCOSE 90 81  BUN 13 8  CREATININE 0.61 0.48  CALCIUM 8.4* 8.3*   Liver Function Tests:  Recent Labs  04/10/16 0542 04/11/16 0533  AST 568* 226*  ALT 700* 430*  ALKPHOS 130* 108  BILITOT 1.4* 1.1  PROT 5.9* 5.3*  ALBUMIN 3.3* 2.9*     CBC:  Recent Labs  04/08/16 1410 04/09/16 0539 04/09/16 1400  WBC 10.4 8.2 7.6  NEUTROABS 9.0*  --   --   HGB 12.9 11.2* 11.2*  HCT 37.9 32.6* 32.4*  MCV 96.9 96.4 96.1  PLT 115* 95* 94*    No results found.    Medications:  Impression: Principal Problem:   Transaminitis Active Problems:   Dementia   Palliative care encounter   Goals of care, counseling/discussion   DNR (do not resuscitate) discussion     Plan:Monitor hepatic profile in a.[name]. consider discharge if clinically improved   Consultants: Gastroenterology   Procedures   Antibiotics:           Time spent: 30 minutes   LOS: 3 days   Alexandra [name]   04/11/2016, 12:19 PM

## 2016-04-12 ENCOUNTER — Encounter (HOSPITAL_COMMUNITY): Payer: Self-pay

## 2016-04-12 LAB — HEPATIC FUNCTION PANEL
ALT: 323 U/L — AB (ref 14–54)
AST: 123 U/L — AB (ref 15–41)
Albumin: 3 g/dL — ABNORMAL LOW (ref 3.5–5.0)
Alkaline Phosphatase: 109 U/L (ref 38–126)
BILIRUBIN DIRECT: 0.2 mg/dL (ref 0.1–0.5)
Indirect Bilirubin: 0.7 mg/dL (ref 0.3–0.9)
TOTAL PROTEIN: 5.5 g/dL — AB (ref 6.5–8.1)
Total Bilirubin: 0.9 mg/dL (ref 0.3–1.2)

## 2016-04-12 LAB — BASIC METABOLIC PANEL
ANION GAP: 3 — AB (ref 5–15)
BUN: 5 mg/dL — ABNORMAL LOW (ref 6–20)
CO2: 30 mmol/L (ref 22–32)
Calcium: 8.3 mg/dL — ABNORMAL LOW (ref 8.9–10.3)
Chloride: 107 mmol/L (ref 101–111)
Creatinine, Ser: 0.51 mg/dL (ref 0.44–1.00)
GFR calc Af Amer: >60 mL/min (ref >60)
GLUCOSE: 83 mg/dL (ref 65–99)
POTASSIUM: 2.9 mmol/L — AB (ref 3.5–5.1)
Sodium: 140 mmol/L (ref 135–145)

## 2016-04-12 MED ORDER — POTASSIUM CHLORIDE CRYS ER 20 MEQ PO TBCR: 20.0000 meq | EXTENDED_RELEASE_TABLET | Freq: Every day | ORAL | 0 refills | Status: DC

## 2016-04-12 MED ORDER — POTASSIUM CHLORIDE CRYS ER 20 MEQ PO TBCR
20.0000 meq | EXTENDED_RELEASE_TABLET | Freq: Every day | ORAL | Status: DC
  Administered 2016-04-12: 20 meq via ORAL
  Filled 2016-04-12: qty 1

## 2016-04-12 NOTE — Discharge Summary (Addendum)
Physician Discharge Summary  Alexandra [name] [id number] DOB: 13-May-1926 DOA: 04/08/2016  PCP: Maricela Curet, MD  Admit date: 04/08/2016 Discharge date: 04/12/2016   Recommendations for Outpatient Follow-up:  Patient is discharged and is recommended to follow my office in 3-5 days' time to assess hepatic function panels to ensure that they are continuing of the downward and normalizing trend her pravastatin 0 take have been discontinued patient is seen to regular diet resume normal activities and take all the mentions mention medicines except the 2 above. Patient and family member agreed to be a no code DO NOT INTUBATE for future reference Discharge Diagnoses:  Principal Problem:   Transaminitis Active Problems:   Dementia   Palliative care encounter   Goals of care, counseling/discussion   DNR (do not resuscitate) discussion   Discharge Condition: Good  Filed Weights   04/08/16 1347 04/08/16 2002 04/10/16 1547  Weight: 42.2 kg (93 lb) 36.4 kg (80 lb 4.8 oz) 36.4 kg (80 lb 4.8 oz)    History of present illness:  Patient is an 82 year old black female. Light pleasantly demented strip hyperlipidemia allergic rhinitis presents to the hospital with severe transaminitis was felt to be possibly gallbladder induced ultrasound and CAT scan of abdomen and pelvis revealed no significant blockages of gallstones or some minor gallstones in the bladder but minimal wall thickening and no sonographic or clinical Murphy's sign acute hepatology's for viral hepatitis were essentially negative daily basis her transaminitis continued to improved and near normal prior to discharge her statin in the form of Pravachol as well as ear take were removed as they are known complications with hepatic derangement syndromes in her ammonia level was quite normal during this admission she was discharged for close follow-up as an outpatient  Hospital Course:  See history of present illness  above  Procedures:    Consultations:  Gastroenterology and end-of-life care palliative care team  Discharge Instructions   Medication List    STOP taking these medications   acetaminophen 650 MG CR tablet Commonly known as:  TYLENOL   ANTACID LIQUID PO   anti-nausea solution   cetirizine 10 MG tablet Commonly known as:  ZYRTEC   pravastatin 40 MG tablet Commonly known as:  PRAVACHOL     TAKE these medications   donepezil 10 MG tablet Commonly known as:  ARICEPT Take 1 tablet (10 mg total) by mouth at bedtime.   hydroxypropyl methylcellulose / hypromellose 2.5 % ophthalmic solution Commonly known as:  ISOPTO TEARS / GONIOVISC 2 drops as needed (for eye lid irritation).   liver oil-zinc oxide 40 % ointment Commonly known as:  DESITIN Apply 1 application topically as needed (for urine burn).   magnesium hydroxide 400 MG/5ML suspension Commonly known as:  MILK OF MAGNESIA Take 30 mLs by mouth daily as needed for mild constipation.   multivitamin with minerals Tabs tablet Take 1 tablet by mouth daily.   neomycin-bacitracin-polymyxin ointment Commonly known as:  NEOSPORIN Apply 1 application topically as needed for wound care. Wash affected area with soap and warm water first; then dry. Apply a small amount as needed for cuts or abrasions.   omeprazole 20 MG capsule Commonly known as:  PRILOSEC Take 20 mg by mouth daily.   phenol 1.4 % Liqd Commonly known as:  CHLORASEPTIC Use as directed 1 spray in the mouth or throat as needed for throat irritation / pain.   potassium chloride SA 20 MEQ tablet Commonly known as:  K-DUR,KLOR-CON Take 1 tablet (20 mEq  total) by mouth daily.   TUSSIN DM 10-100 MG/5ML liquid Generic drug:  Dextromethorphan-Guaifenesin Take 15 mLs by mouth every 4 (four) hours as needed (for simple cough). Do not exceed 4 doses in 24 hours.        Discharge Instructions    Discharge instructions    Complete by:  As directed     Discharge patient    Complete by:  As directed       Allergies  Allergen Reactions  . Aspirin     REACTION: "Messes up my heart and tells it to stop"  . Chocolate   . Penicillins     Has patient had a PCN reaction causing immediate rash, facial/tongue/throat swelling, SOB or lightheadedness with hypotension: unknown Has patient had a PCN reaction causing severe rash involving mucus membranes or skin necrosis: unknown Has patient had a PCN reaction that required hospitalization unknown Has patient had a PCN reaction occurring within the last 10 years: unknown If all of the above answers are "NO", then may proceed with Cephalosporin use. REACTION: Pains and feels it messes her up      The results of significant diagnostics from this hospitalization (including imaging, microbiology, ancillary and laboratory) are listed below for reference.    Significant Diagnostic Studies: Dg Chest 2 View  Result Date: 04/08/2016 CLINICAL DATA:  Weakness, history of breast cancer, dementia EXAM: CHEST  2 VIEW COMPARISON:  11/11/2014 FINDINGS: The heart size and mediastinal contours are within normal limits. Both lungs are clear. The visualized skeletal structures are unremarkable. IMPRESSION: No active cardiopulmonary disease. Electronically Signed   By: Kathreen Devoid   On: 04/08/2016 15:11   Ct Head Wo Contrast  Result Date: 04/08/2016 CLINICAL DATA:  Altered level of consciousness.  Dementia. EXAM: CT HEAD WITHOUT CONTRAST TECHNIQUE: Contiguous axial images were obtained from the base of the skull through the vertex without intravenous contrast. COMPARISON:  07/11/2015 FINDINGS: Brain: Stable cerebral atrophy with stable enlargement of the ventricles. Stable low-density in the periventricular white matter. Negative for acute hemorrhage, mass lesion, midline shift or large infarct. Vascular: No hyperdense vessel or unexpected calcification. Skull: Normal. Negative for fracture or focal lesion.  Sinuses/Orbits: No acute finding. Other: None. IMPRESSION: No acute intracranial abnormality. Stable atrophy and ventricular dilatation. Electronically Signed   By: Markus Daft M.D.   On: 04/08/2016 15:19   Ct Abdomen Pelvis W Contrast  Result Date: 04/08/2016 CLINICAL DATA:  Right upper quadrant pain EXAM: CT ABDOMEN AND PELVIS WITH CONTRAST TECHNIQUE: Multidetector CT imaging of the abdomen and pelvis was performed using the standard protocol following bolus administration of intravenous contrast. CONTRAST:  146mL ISOVUE-300 IOPAMIDOL (ISOVUE-300) INJECTION 61% COMPARISON:  None. FINDINGS: Lower chest: No pleural or pericardial effusion. Subpleural consolidation within the right base is noted, image 5 of series 6. Hepatobiliary: No focal liver abnormality. Multiple stones are identified within the gallbladder. There is mild pericholecystic fluid noted. Pancreas: Unremarkable. No pancreatic ductal dilatation or surrounding inflammatory changes. Spleen: Normal in size without focal abnormality. Adrenals/Urinary Tract: Adrenal glands are unremarkable. There is a cyst within the inferior pole of the right kidney noted CED measuring 3.2 cm, image 24 of series 2. Left kidney is normal, without renal calculi, focal lesion, or hydronephrosis. Bladder is unremarkable. Stomach/Bowel: The stomach is within normal limits. The small bowel loops have a normal course and caliber. No obstruction. Normal appearance of the colon. Numerous distal colonic diverticula identified. Mild wall thickening involving the sigmoid colon is identified. Vascular/Lymphatic: Calcified atherosclerotic disease  involves the abdominal aorta. No aneurysm. No enlarged retroperitoneal or mesenteric adenopathy. No enlarged pelvic or inguinal lymph nodes. Reproductive: The uterus is not visualized and may be surgically absent or atrophic. No adnexal masses. Other: There is a small amount of free fluid noted within the dependent portion of the pelvis.  Musculoskeletal: Degenerative disc disease identified within the lumbar spine. IMPRESSION: 1. Gallstones and mild pericholecystic fluid. If there is a concern for acute cholecystitis consider further investigation with abdominal sonogram. 2. Small amount of free fluid noted within the dependent portion of the pelvis. 3. Sigmoid diverticulosis. Mild wall thickening involving the sigmoid colon is noted. Correlation for any clinical signs or symptoms of cholecystitis. Electronically Signed   By: Kerby Moors M.D.   On: 04/08/2016 21:02   US Abdomen Limited Ruq  Result Date: 04/09/2016 CLINICAL DATA:  Elevated liver function studies. History of kidney stones. EXAM: US ABDOMEN LIMITED - RIGHT UPPER QUADRANT COMPARISON:  Renal ultrasound of Nov 14, 2014 FINDINGS: Gallbladder: There are multiple echogenic mobile shadowing stones. There is mild gallbladder wall thickening to 4 mm. There is no pericholecystic fluid or positive sonographic Murphy's sign. Common bile duct: Diameter: 2.6 mm Liver: The hepatic echotexture is slightly decreased overall with prominence of the portal triads. There is no focal mass or ductal dilation. IMPRESSION: Multiple mobile gallstones with mild chronic thickening of the gallbladder wall to 4 mm. No positive sonographic Murphy's sign. Mildly decreased hepatic echotexture chronically without discrete mass or ductal dilation. Electronically Signed   By: David  Martinique M.D.   On: 04/09/2016 09:12    Microbiology: No results found for this or any previous visit (from the past 240 hour(s)).   Labs: Basic Metabolic Panel:  Recent Labs Lab 04/08/16 1410 04/09/16 0539 04/10/16 0542 04/11/16 0533 04/12/16 0606  NA 138 140 141 141 140  K 3.6 3.6 3.5 3.0* 2.9*  CL 105 108 110 109 107  CO2 27 28 28 28 30   GLUCOSE 105* 74 90 81 83  BUN 16 13 13 8  5*  CREATININE 0.83 0.60 0.61 0.48 0.51  CALCIUM 9.5 8.6* 8.4* 8.3* 8.3*   Liver Function Tests:  Recent Labs Lab 04/08/16 1410  04/09/16 0539 04/10/16 0542 04/11/16 0533 04/12/16 0606  AST 4,948* 1,438* 568* 226* 123*  ALT 2,073* 1,135* 700* 430* 323*  ALKPHOS 153* 122 130* 108 109  BILITOT 3.5* 3.5* 1.4* 1.1 0.9  PROT 6.9 5.8* 5.9* 5.3* 5.5*  ALBUMIN 4.0 3.3* 3.3* 2.9* 3.0*   No results for input(s): LIPASE, AMYLASE in the last 168 hours.  Recent Labs Lab 04/09/16 1400  AMMONIA 11   CBC:  Recent Labs Lab 04/08/16 1410 04/09/16 0539 04/09/16 1400  WBC 10.4 8.2 7.6  NEUTROABS 9.0*  --   --   HGB 12.9 11.2* 11.2*  HCT 37.9 32.6* 32.4*  MCV 96.9 96.4 96.1  PLT 115* 95* 94*   Cardiac Enzymes: No results for input(s): CKTOTAL, CKMB, CKMBINDEX, TROPONINI in the last 168 hours. BNP: BNP (last 3 results) No results for input(s): BNP in the last 8760 hours.  ProBNP (last 3 results) No results for input(s): PROBNP in the last 8760 hours.  CBG: No results for input(s): GLUCAP in the last 168 hours.     Signed:  Christalyn Goertz Jerilynn Mages  [facility] Pager: [phone] 04/12/2016, 12:25 PM

## 2016-04-12 NOTE — Progress Notes (Signed)
Following peripherally, patient not seen/examined.  Labs today: significant decline in AST/ALT (123/323 from 226/430 yesterday), continued without rise in bilirubin (actually decreased from 1.1 to 0.9; 3 days ago was 3.5).  Will continue to follow peripherally versus sign-off for now.

## 2016-04-12 NOTE — NC FL2 (Signed)
[place] MEDICAID FL2 LEVEL OF CARE SCREENING TOOL     IDENTIFICATION  Patient Name: Alexandra [name] Birthdate: [birth year] Sex: female Admission Date (Current Location): 04/08/2016  [facility] and [place] Number:  [employer] and Address:  [place] [address], [place]      Provider Number: [id number]  Attending Physician Name and Address:  Lucia Gaskins, MD  Relative Name and Phone Number:       Current Level of Care: Hospital Recommended Level of Care: [place] Prior Approval Number:    Date Approved/Denied:   PASRR Number:    Discharge Plan: Other (Comment) ([place])    Current Diagnoses: Patient Active Problem List   Diagnosis Date Noted  . Palliative care encounter   . Goals of care, counseling/discussion   . DNR (do not resuscitate) discussion   . Transaminitis 04/08/2016  . Fall at home 07/12/2015  . Dementia 07/12/2015  . Dyslipidemia 07/12/2015  . RBBB-no old EKGs 07/12/2015  . Bradycardia-HR 52 on adm EKG 07/12/2015  . Elevated troponin 07/11/2015  . NSTEMI (non-ST elevated myocardial infarction) ([place]) 07/11/2015  . Elevated LFTs   . Altered mental status 11/12/2014  . Gastroenteritis 11/12/2014  . Fever 11/12/2014  . LEUKOPENIA, MILD 04/04/2009  . TRANSAMINASES, SERUM, ELEVATED 04/04/2009  . GRIEF REACTION 09/06/2008  . DECREASED HEARING 02/11/2008  . SPINAL STENOSIS 02/18/2007  . History of breast cancer 01/15/2007  . ANXIETY 01/15/2007  . PERIPHERAL NEUROPATHY 01/15/2007  . CATARACT NOS 01/15/2007  . ALLERGIC RHINITIS 01/15/2007  . ASTHMA 01/15/2007  . GERD 01/15/2007  . CONSTIPATION 01/15/2007  . IBS 01/15/2007  . ARTHRITIS 01/15/2007    Orientation RESPIRATION BLADDER Height & Weight     Self  Normal Incontinent Weight: 80 lb 4.8 oz (36.4 kg) Height:  5' (152.4 cm)  BEHAVIORAL SYMPTOMS/MOOD NEUROLOGICAL BOWEL NUTRITION STATUS      Incontinent Diet (Dysphagia 3 with  thin liquids)  AMBULATORY STATUS COMMUNICATION OF NEEDS Skin   Extensive Assist Verbally Normal                       Personal Care Assistance Level of Assistance  Bathing, Feeding, Dressing Bathing Assistance: Maximum assistance Feeding assistance: Limited assistance Dressing Assistance: Maximum assistance     Functional Limitations Info  Sight, Hearing, Speech Sight Info: Adequate Hearing Info: Adequate Speech Info: Adequate    SPECIAL CARE FACTORS FREQUENCY                       Contractures     Additional Factors Info  Code Status, Allergies Code Status Info: DNR Allergies Info: Aspirin, Chocolate, Penicillns           Current Medications (04/12/2016):  This is the current hospital active medication list Current Facility-Administered Medications  Medication Dose Route Frequency Provider Last Rate Last Dose  . 0.9 %  sodium chloride infusion   Intravenous Continuous Erline Hau, MD 75 mL/hr at 04/11/16 1258    . cefTRIAXone (ROCEPHIN) 2 g in dextrose 5 % 50 mL IVPB  2 g Intravenous Q24H Lucia Gaskins, MD   2 g at 04/11/16 1258  . docusate sodium (COLACE) capsule 100 mg  100 mg Oral BID Erline Hau, MD   100 mg at 04/12/16 0917  . donepezil (ARICEPT) tablet 10 mg  10 mg Oral QHS Erline Hau, MD   10 mg at  04/11/16 2345  . enoxaparin (LOVENOX) injection 30 mg  30 mg Subcutaneous Q24H Lucia Gaskins, MD   30 mg at 04/11/16 2344  . guaiFENesin-dextromethorphan (ROBITUSSIN DM) 100-10 MG/5ML syrup 10 mL  10 mL Oral Q4H PRN Lucia Gaskins, MD   10 mL at 04/09/16 2109  . multivitamin with minerals tablet 1 tablet  1 tablet Oral Daily Erline Hau, MD   1 tablet at 04/12/16 [phone]  . ondansetron (ZOFRAN) tablet 4 mg  4 mg Oral Q6H PRN Erline Hau, MD       Or  . ondansetron [facility]) injection 4 mg  4 mg Intravenous Q6H PRN Erline Hau, MD      . pantoprazole (PROTONIX) EC tablet  40 mg  40 mg Oral Daily Erline Hau, MD   40 mg at 04/12/16 0917  . polyvinyl alcohol (LIQUIFILM TEARS) 1.4 % ophthalmic solution 2 drop  2 drop Both Eyes PRN Lucia Gaskins, MD      . potassium chloride SA (K-DUR,KLOR-CON) CR tablet 20 mEq  20 mEq Oral Daily Lucia Gaskins, MD         Discharge Medications: Medication List    STOP taking these medications   acetaminophen 650 MG CR tablet Commonly known as:  TYLENOL  ANTACID LIQUID PO  anti-nausea solution  cetirizine 10 MG tablet Commonly known as:  ZYRTEC  pravastatin 40 MG tablet Commonly known as:  PRAVACHOL    TAKE these medications   donepezil 10 MG tablet Commonly known as:  ARICEPT Take 1 tablet (10 mg total) by mouth at bedtime.  hydroxypropyl methylcellulose / hypromellose 2.5 % ophthalmic solution Commonly known as:  ISOPTO TEARS / GONIOVISC 2 drops as needed (for eye lid irritation).  liver oil-zinc oxide 40 % ointment Commonly known as:  DESITIN Apply 1 application topically as needed (for urine burn).  magnesium hydroxide 400 MG/5ML suspension Commonly known as:  MILK OF MAGNESIA Take 30 mLs by mouth daily as needed for mild constipation.  multivitamin with minerals Tabs tablet Take 1 tablet by mouth daily.  neomycin-bacitracin-polymyxin ointment Commonly known as:  NEOSPORIN Apply 1 application topically as needed for wound care. Wash affected area with soap and warm water first; then dry. Apply a small amount as needed for cuts or abrasions.  omeprazole 20 MG capsule Commonly known as:  PRILOSEC Take 20 mg by mouth daily.  phenol 1.4 % Liqd Commonly known as:  CHLORASEPTIC Use as directed 1 spray in the mouth or throat as needed for throat irritation / pain.  potassium chloride SA 20 MEQ tablet Commonly known as:  K-DUR,KLOR-CON Take 1 tablet (20 mEq total) by mouth daily.  TUSSIN DM 10-100 MG/5ML liquid Generic drug:  Dextromethorphan-Guaifenesin Take 15 mLs by mouth every 4 (four)  hours as needed (for simple cough). Do not exceed 4 doses in 24 hours.       Relevant Imaging Results:  Relevant Lab Results:   Additional Information    Salome Arnt, [place]

## 2016-04-12 NOTE — Clinical Social Work Note (Signed)
Pt d/c today back to [facility]. Pt's son, Iona Beard and facility aware and agreeable. Facility will pick up pt this afternoon.  Benay Pike, [place]

## 2016-04-12 NOTE — Care Management Note (Signed)
Case Management Note  Patient Details  Name: Alexandra [name] MRN: [id number] Date of Birth: [birth year]  Expected Discharge Date:     04/12/2016             Expected Discharge Plan:  Assisted Living / Rest Home  In-House Referral:  Clinical Social Work  Discharge planning Services  CM Consult  Post Acute Care Choice:  NA Choice offered to:  NA  DME Arranged:    DME Agency:     HH Arranged:    [place] Agency:     Status of Service:  Completed, signed off  If discussed at [employer], dates discussed:    Additional Comments: Pt returning to ALF today. CSW has made arrangements for return to facility. No CM needs.   Sherald Barge, RN 04/12/2016, 1:21 PM

## 2016-04-12 NOTE — Care Management Important Message (Signed)
Important Message  Patient Details  Name: CAIDEN DOLLARHIDE MRN: [id number] Date of Birth: 09-12-1925   Medicare Important Message Given:  Yes    Sherald Barge, RN 04/12/2016, 1:21 PM

## 2016-04-12 NOTE — Progress Notes (Signed)
Pt's IV catheter removed and intact. Pt's IV site clean dry and intact. Report given to Tonna Boehringer ([facility]). All questions were answered and no further questions at this time. Pt in stable condition and in no acute distress at time of discharge. Pt will be escorted by nurse tech.

## 2018-02-06 ENCOUNTER — Emergency Department (HOSPITAL_COMMUNITY): Payer: Medicare Other

## 2018-02-06 ENCOUNTER — Inpatient Hospital Stay (HOSPITAL_COMMUNITY)
Admission: EM | Admit: 2018-02-06 | Discharge: 2018-02-13 | DRG: 981 | Disposition: A | Discharge status: Hospice | Payer: Medicare Other | Attending: Family Medicine | Admitting: Family Medicine

## 2018-02-06 ENCOUNTER — Other Ambulatory Visit: Payer: Self-pay

## 2018-02-06 ENCOUNTER — Encounter (HOSPITAL_COMMUNITY): Payer: Self-pay

## 2018-02-06 DIAGNOSIS — F0390 Unspecified dementia without behavioral disturbance: Secondary | ICD-10-CM | POA: Diagnosis not present

## 2018-02-06 DIAGNOSIS — Z87891 Personal history of nicotine dependence: Secondary | ICD-10-CM

## 2018-02-06 DIAGNOSIS — E872 Acidosis, unspecified: Secondary | ICD-10-CM | POA: Diagnosis present

## 2018-02-06 DIAGNOSIS — Z681 Body mass index (BMI) 19 or less, adult: Secondary | ICD-10-CM | POA: Diagnosis not present

## 2018-02-06 DIAGNOSIS — F0150 Vascular dementia without behavioral disturbance: Secondary | ICD-10-CM

## 2018-02-06 DIAGNOSIS — E785 Hyperlipidemia, unspecified: Secondary | ICD-10-CM | POA: Diagnosis present

## 2018-02-06 DIAGNOSIS — E8809 Other disorders of plasma-protein metabolism, not elsewhere classified: Secondary | ICD-10-CM | POA: Diagnosis not present

## 2018-02-06 DIAGNOSIS — R0600 Dyspnea, unspecified: Secondary | ICD-10-CM | POA: Diagnosis not present

## 2018-02-06 DIAGNOSIS — L089 Local infection of the skin and subcutaneous tissue, unspecified: Secondary | ICD-10-CM

## 2018-02-06 DIAGNOSIS — L0889 Other specified local infections of the skin and subcutaneous tissue: Secondary | ICD-10-CM | POA: Diagnosis present

## 2018-02-06 DIAGNOSIS — Z66 Do not resuscitate: Secondary | ICD-10-CM | POA: Diagnosis present

## 2018-02-06 DIAGNOSIS — Z515 Encounter for palliative care: Secondary | ICD-10-CM

## 2018-02-06 DIAGNOSIS — E860 Dehydration: Secondary | ICD-10-CM | POA: Diagnosis present

## 2018-02-06 DIAGNOSIS — R4701 Aphasia: Secondary | ICD-10-CM | POA: Diagnosis present

## 2018-02-06 DIAGNOSIS — K2100 Gastro-esophageal reflux disease with esophagitis, without bleeding: Secondary | ICD-10-CM

## 2018-02-06 DIAGNOSIS — T148XXA Other injury of unspecified body region, initial encounter: Secondary | ICD-10-CM | POA: Diagnosis not present

## 2018-02-06 DIAGNOSIS — Z7189 Other specified counseling: Secondary | ICD-10-CM | POA: Diagnosis not present

## 2018-02-06 DIAGNOSIS — D72829 Elevated white blood cell count, unspecified: Secondary | ICD-10-CM | POA: Diagnosis present

## 2018-02-06 DIAGNOSIS — L89159 Pressure ulcer of sacral region, unspecified stage: Secondary | ICD-10-CM

## 2018-02-06 DIAGNOSIS — Z853 Personal history of malignant neoplasm of breast: Secondary | ICD-10-CM | POA: Diagnosis not present

## 2018-02-06 DIAGNOSIS — E43 Unspecified severe protein-calorie malnutrition: Secondary | ICD-10-CM | POA: Diagnosis present

## 2018-02-06 DIAGNOSIS — E870 Hyperosmolality and hypernatremia: Secondary | ICD-10-CM | POA: Diagnosis not present

## 2018-02-06 DIAGNOSIS — S31000A Unspecified open wound of lower back and pelvis without penetration into retroperitoneum, initial encounter: Secondary | ICD-10-CM

## 2018-02-06 DIAGNOSIS — L89154 Pressure ulcer of sacral region, stage 4: Secondary | ICD-10-CM | POA: Diagnosis present

## 2018-02-06 LAB — CBC WITH DIFFERENTIAL/PLATELET
Basophils Absolute: 0 10*3/uL (ref 0.0–0.1)
Basophils Relative: 0 %
Eosinophils Absolute: 0 10*3/uL (ref 0.0–0.7)
Eosinophils Relative: 0 %
HCT: 39.9 % (ref 36.0–46.0)
Hemoglobin: 12.6 g/dL (ref 12.0–15.0)
LYMPHS ABS: 1.1 10*3/uL (ref 0.7–4.0)
LYMPHS PCT: 7 %
MCH: 32.7 pg (ref 26.0–34.0)
MCHC: 31.6 g/dL (ref 30.0–36.0)
MCV: 103.6 fL — ABNORMAL HIGH (ref 78.0–100.0)
Monocytes Absolute: 0.9 10*3/uL (ref 0.1–1.0)
Monocytes Relative: 6 %
NEUTROS PCT: 87 %
Neutro Abs: 13.5 10*3/uL — ABNORMAL HIGH (ref 1.7–7.7)
PLATELETS: 185 10*3/uL (ref 150–400)
RBC: 3.85 MIL/uL — AB (ref 3.87–5.11)
RDW: 14.3 % (ref 11.5–15.5)
WBC: 15.6 10*3/uL — AB (ref 4.0–10.5)

## 2018-02-06 LAB — URINALYSIS, ROUTINE W REFLEX MICROSCOPIC
BILIRUBIN URINE: NEGATIVE
Glucose, UA: NEGATIVE mg/dL
HGB URINE DIPSTICK: NEGATIVE
Ketones, ur: NEGATIVE mg/dL
Leukocytes, UA: NEGATIVE
NITRITE: NEGATIVE
PROTEIN: NEGATIVE mg/dL
Specific Gravity, Urine: 1.019 (ref 1.005–1.030)
pH: 5 (ref 5.0–8.0)

## 2018-02-06 LAB — COMPREHENSIVE METABOLIC PANEL
ALBUMIN: 2.3 g/dL — AB (ref 3.5–5.0)
ALK PHOS: 87 U/L (ref 38–126)
ALT: 35 U/L (ref 0–44)
ANION GAP: 11 (ref 5–15)
AST: 44 U/L — ABNORMAL HIGH (ref 15–41)
BUN: 59 mg/dL — ABNORMAL HIGH (ref 8–23)
CO2: 28 mmol/L (ref 22–32)
Calcium: 10.3 mg/dL (ref 8.9–10.3)
Chloride: 125 mmol/L — ABNORMAL HIGH (ref 98–111)
Creatinine, Ser: 1.04 mg/dL — ABNORMAL HIGH (ref 0.44–1.00)
GFR calc Af Amer: 53 mL/min — ABNORMAL LOW (ref >60)
GFR calc non Af Amer: 46 mL/min — ABNORMAL LOW (ref >60)
GLUCOSE: 142 mg/dL — AB (ref 70–99)
POTASSIUM: 4.5 mmol/L (ref 3.5–5.1)
SODIUM: 164 mmol/L — AB (ref 135–145)
Total Bilirubin: 0.8 mg/dL (ref 0.3–1.2)
Total Protein: 6.9 g/dL (ref 6.5–8.1)

## 2018-02-06 LAB — LACTIC ACID, PLASMA
Lactic Acid, Venous: 3.1 mmol/L (ref 0.5–1.9)
Lactic Acid, Venous: 3.9 mmol/L (ref 0.5–1.9)

## 2018-02-06 LAB — SEDIMENTATION RATE: Sed Rate: 88 mm/hr — ABNORMAL HIGH (ref 0–22)

## 2018-02-06 LAB — C-REACTIVE PROTEIN: CRP: 24.6 mg/dL — ABNORMAL HIGH (ref <1.0)

## 2018-02-06 MED ORDER — MAGNESIUM HYDROXIDE 400 MG/5ML PO SUSP: 30.0000 mL | Freq: Every day | ORAL | Status: DC | PRN

## 2018-02-06 MED ORDER — DONEPEZIL HCL 5 MG PO TABS
10.0000 mg | ORAL_TABLET | Freq: Every day | ORAL | Status: DC
  Administered 2018-02-08 – 2018-02-12 (×5): 10 mg via ORAL
  Filled 2018-02-06 (×7): qty 2

## 2018-02-06 MED ORDER — SODIUM CHLORIDE 0.45 % IV SOLN
INTRAVENOUS | Status: DC
  Administered 2018-02-06: 19:00:00 via INTRAVENOUS

## 2018-02-06 MED ORDER — METRONIDAZOLE IN NACL 5-0.79 MG/ML-% IV SOLN
500.0000 mg | Freq: Three times a day (TID) | INTRAVENOUS | Status: DC
  Administered 2018-02-06 – 2018-02-10 (×12): 500 mg via INTRAVENOUS
  Filled 2018-02-06 (×12): qty 100

## 2018-02-06 MED ORDER — SODIUM CHLORIDE 0.9 % IV BOLUS
500.0000 mL | Freq: Once | INTRAVENOUS | Status: AC
  Administered 2018-02-06: 500 mL via INTRAVENOUS

## 2018-02-06 MED ORDER — ACETAMINOPHEN ER 650 MG PO TBCR: 650.0000 mg | EXTENDED_RELEASE_TABLET | ORAL | Status: DC | PRN

## 2018-02-06 MED ORDER — SENNA 8.6 MG PO TABS
1.0000 | ORAL_TABLET | Freq: Two times a day (BID) | ORAL | Status: DC
  Administered 2018-02-08 – 2018-02-13 (×8): 8.6 mg via ORAL
  Filled 2018-02-06 (×11): qty 1

## 2018-02-06 MED ORDER — ADULT MULTIVITAMIN W/MINERALS CH: 1.0000 | ORAL_TABLET | Freq: Every day | ORAL | Status: DC

## 2018-02-06 MED ORDER — VANCOMYCIN HCL IN DEXTROSE 1-5 GM/200ML-% IV SOLN
1000.0000 mg | Freq: Once | INTRAVENOUS | Status: AC
  Administered 2018-02-06: 1000 mg via INTRAVENOUS
  Filled 2018-02-06: qty 200

## 2018-02-06 MED ORDER — PANTOPRAZOLE SODIUM 40 MG PO TBEC
40.0000 mg | DELAYED_RELEASE_TABLET | Freq: Every day | ORAL | Status: DC
  Administered 2018-02-08 – 2018-02-13 (×6): 40 mg via ORAL
  Filled 2018-02-06 (×6): qty 1

## 2018-02-06 MED ORDER — DOCUSATE SODIUM 100 MG PO CAPS
100.0000 mg | ORAL_CAPSULE | Freq: Two times a day (BID) | ORAL | Status: DC
  Administered 2018-02-09 – 2018-02-12 (×3): 100 mg via ORAL
  Filled 2018-02-06 (×8): qty 1

## 2018-02-06 MED ORDER — SODIUM CHLORIDE 0.9 % IV SOLN
INTRAVENOUS | Status: DC
  Administered 2018-02-06: 15:00:00 via INTRAVENOUS

## 2018-02-06 MED ORDER — ENSURE ENLIVE PO LIQD
237.0000 mL | Freq: Two times a day (BID) | ORAL | Status: DC
  Administered 2018-02-08 – 2018-02-13 (×10): 237 mL via ORAL

## 2018-02-06 MED ORDER — VANCOMYCIN HCL IN DEXTROSE 750-5 MG/150ML-% IV SOLN
750.0000 mg | INTRAVENOUS | Status: DC
Start: 2018-02-08 — End: 2018-02-10
  Administered 2018-02-08: 750 mg via INTRAVENOUS
  Filled 2018-02-06 (×2): qty 150

## 2018-02-06 MED ORDER — ONDANSETRON HCL 4 MG PO TABS: 4.0000 mg | ORAL_TABLET | Freq: Four times a day (QID) | ORAL | Status: DC | PRN

## 2018-02-06 MED ORDER — HEPARIN SODIUM (PORCINE) 5000 UNIT/ML IJ SOLN
5000.0000 [IU] | Freq: Three times a day (TID) | INTRAMUSCULAR | Status: DC
  Administered 2018-02-06 – 2018-02-13 (×20): 5000 [IU] via SUBCUTANEOUS
  Filled 2018-02-06 (×20): qty 1

## 2018-02-06 MED ORDER — SODIUM CHLORIDE 0.9 % IV BOLUS
250.0000 mL | Freq: Once | INTRAVENOUS | Status: AC
  Administered 2018-02-06: 250 mL via INTRAVENOUS

## 2018-02-06 MED ORDER — SODIUM CHLORIDE 0.9 % IV BOLUS
250.0000 mL | Freq: Once | INTRAVENOUS | Status: AC
Start: 2018-02-06 — End: 2018-02-06
  Administered 2018-02-06: 250 mL via INTRAVENOUS

## 2018-02-06 MED ORDER — ACETAMINOPHEN 325 MG PO TABS: 650.0000 mg | ORAL_TABLET | ORAL | Status: DC | PRN

## 2018-02-06 MED ORDER — SODIUM CHLORIDE 0.9 % IV SOLN
1.0000 g | INTRAVENOUS | Status: DC
  Administered 2018-02-06 – 2018-02-09 (×4): 1 g via INTRAVENOUS
  Filled 2018-02-06: qty 1
  Filled 2018-02-06 (×3): qty 10
  Filled 2018-02-06: qty 1
  Filled 2018-02-06: qty 10

## 2018-02-06 MED ORDER — ONDANSETRON HCL 4 MG/2ML IJ SOLN: 4.0000 mg | Freq: Four times a day (QID) | INTRAMUSCULAR | Status: DC | PRN

## 2018-02-06 NOTE — Progress Notes (Signed)
Pharmacy Antibiotic Note  Alexandra [name] is a 84 y.o. female admitted on 02/06/2018 with wound infection.  Pharmacy has been consulted for Vancomycin dosing.  Plan: Vancomycin 1000mg  loading dose, then 750mg   IV every 48 hours.  Goal trough 10-15 mcg/mL.  Also, on ceftriaxone1gm IV q24h Flagyl 500mg  IV q8h F/U cxs and clinical progress Monitor V/s, labs and levels as indicated  Height: 5' (152.4 cm) Weight: 82 lb 3.7 oz (37.3 kg) IBW/kg (Calculated) : 45.5  Temp (24hrs), Avg:96.9 F (36.1 C), Min:96.7 F (35.9 C), Max:97 F (36.1 C)  Recent Labs  Lab 02/06/18 1226 02/06/18 1356  WBC 15.6*  --   CREATININE 1.04*  --   LATICACIDVEN 3.1* 3.9*    Estimated Creatinine Clearance: 21.2 mL/min (A) (by C-G formula based on SCr of 1.04 mg/dL (H)).    Allergies  Allergen Reactions  . Aspirin     REACTION: "Messes up my heart and tells it to stop"  . Chocolate   . Penicillins     Has patient had a PCN reaction causing immediate rash, facial/tongue/throat swelling, SOB or lightheadedness with hypotension: unknown Has patient had a PCN reaction causing severe rash involving mucus membranes or skin necrosis: unknown Has patient had a PCN reaction that required hospitalization unknown Has patient had a PCN reaction occurring within the last 10 years: unknown If all of the above answers are "NO", then may proceed with Cephalosporin use. REACTION: Pains and feels it messes her up    Antimicrobials this admission: Vancomycin 7/26 >>  Ceftriaxone 7/26>>  Flagyl 7/26>>  Dose adjustments this admission: n/a  Microbiology results:  BCx:  7/26 UCx: pending  MRSA PCR:   Thank you for allowing pharmacy to be a part of this patient's care.  Isac Sarna, BS [place], [place] Clinical Pharmacist Pager [phone] 02/06/2018 7:13 PM

## 2018-02-06 NOTE — Progress Notes (Signed)
Patients rectal temperature 45F. Mid Level notified. New orders placed and followed. Warming blanket on. Will continue to monitor.

## 2018-02-06 NOTE — H&P (Signed)
History and Physical    Alexandra [name] [id number] DOB: March 09, 1926 DOA: 02/06/2018  PCP: Lucia Gaskins, MD  [facility] or group home)  I have personally briefly reviewed patient's old medical records in [place]  Chief Complaint: possible sacral wound infection  HPI: Alexandra [name] is a 84 y.o. female with medical history significant of advanced dementia, basically aphasia other than moaning, severe malnutrition with hypoalbuminemia and skin on bones type of looking, was referred by her PCP to ER for evaluation of possible osteomyelitis of stage 4 sacral decubitus ulcer. Pt is demented and not able to tell any words. No family available. Per ER attending who discussed with pt's PCP, apparently, the house care staffs contacted PCP regarding pt's sacral wound. After the discussion, PCP instructed staffs to bring patient directly to ER and to see if pt can have a sacral MRI to rule in or out of osteomyelitis as the wound per staffs was deep and covered with feces.   ED Course: pt is afebrile upon arrival. She's tachycardia but BP maintained. She's found to have severe hypernatremia with Na 164, Cl 125, BUN 59 and Cr 1.04, BUN/Cr close to 60. Albumin 2.3. Lactate 3.1. Wbc 15.6. Both XR of sacrum and pelvis did not show osteomyelitis. Pt has a large sacral decubitus ulcer with feces covering it and has strong foul smell with suspicion of infection.  hospitalist is consulted to further manage her hypernatremia and stage 4 sacral decubitus ulcer with likely infection.   Review of Systems: not able to review due to dementia    Past Medical History:  Diagnosis Date  . Breast cancer ([place])   . Cataract   . Chronic neck and back pain   . Dementia   . Dementia   . Hyperlipidemia   . IBS (irritable bowel syndrome)     Past Surgical History:  Procedure Laterality Date  . MASTECTOMY       reports that she has quit smoking.  She has never used smokeless tobacco. She reports that she does not drink alcohol or use drugs.  Allergies  Allergen Reactions  . Aspirin     REACTION: "Messes up my heart and tells it to stop"  . Chocolate   . Penicillins     Has patient had a PCN reaction causing immediate rash, facial/tongue/throat swelling, SOB or lightheadedness with hypotension: unknown Has patient had a PCN reaction causing severe rash involving mucus membranes or skin necrosis: unknown Has patient had a PCN reaction that required hospitalization unknown Has patient had a PCN reaction occurring within the last 10 years: unknown If all of the above answers are "NO", then may proceed with Cephalosporin use. REACTION: Pains and feels it messes her up    Family History  Problem Relation Age of Onset  . Colon cancer Unknown        unknown    Prior to Admission medications   Medication Sig Start Date End Date Taking? Authorizing Provider  acetaminophen (TYLENOL) 650 MG CR tablet Take 650 mg by mouth every 4 (four) hours as needed for pain or fever.   Yes [provider]  alum & mag hydroxide-simeth (MAALOX/MYLANTA) 200-200-20 MG/5ML suspension Take 30 mLs by mouth every 6 (six) hours as needed for indigestion or heartburn.   Yes [provider]  anti-nausea (EMETROL) solution Take 10 mLs by mouth every 15 (fifteen) minutes as needed for nausea or vomiting.  Yes [provider]  dextromethorphan-guaiFENesin (MUCINEX DM) 30-600 MG 12hr tablet Take 1 tablet by mouth at bedtime.   Yes [provider]  donepezil (ARICEPT) 10 MG tablet Take 1 tablet (10 mg total) by mouth at bedtime. 11/16/14  Yes Lucia Gaskins, MD  Multiple Vitamin (MULTIVITAMIN WITH MINERALS) TABS tablet Take 1 tablet by mouth daily.   Yes [provider]  omeprazole (PRILOSEC) 20 MG capsule Take 20 mg by mouth daily. 10/20/14  Yes [provider]  potassium chloride SA (K-DUR,KLOR-CON) 20 MEQ tablet  Take 1 tablet (20 mEq total) by mouth daily. 04/12/16  Yes Lucia Gaskins, MD  Dextromethorphan-Guaifenesin (TUSSIN DM) 10-100 MG/5ML liquid Take 15 mLs by mouth every 4 (four) hours as needed (for simple cough). Do not exceed 4 doses in 24 hours.    [provider]  hydroxypropyl methylcellulose / hypromellose (ISOPTO TEARS / GONIOVISC) 2.5 % ophthalmic solution 2 drops as needed (for eye lid irritation).    [provider]  liver oil-zinc oxide (DESITIN) 40 % ointment Apply 1 application topically as needed (for urine burn).    [provider]  magnesium hydroxide (MILK OF MAGNESIA) 400 MG/5ML suspension Take 30 mLs by mouth daily as needed for mild constipation.    [provider]  neomycin-bacitracin-polymyxin (NEOSPORIN) ointment Apply 1 application topically as needed for wound care. Wash affected area with soap and warm water first; then dry. Apply a small amount as needed for cuts or abrasions.    [provider]  phenol (CHLORASEPTIC) 1.4 % LIQD Use as directed 1 spray in the mouth or throat as needed for throat irritation / pain.    [provider]    Physical Exam: Vitals:   02/06/18 1545 02/06/18 1624 02/06/18 1630 02/06/18 1634  BP: 134/68 97/62  (!) 102/56  Pulse: 86  (!) 104   Resp: 18     Temp:      TempSrc:      SpO2: 98%  98%   Weight:        Constitutional: NAD, calm, comfortable Vitals:   02/06/18 1545 02/06/18 1624 02/06/18 1630 02/06/18 1634  BP: 134/68 97/62  (!) 102/56  Pulse: 86  (!) 104   Resp: 18     Temp:      TempSrc:      SpO2: 98%  98%   Weight:      General: extremely cachectic, skin on bones, temporal waste, severely malnourished, dehydrated, no acute distress but aphasia other than moaning with pain when check her sacral wound; not following commands Eyes: PERRL, lids and conjunctivae normal ENMT: Mucous membranes are dry.  Neck: normal, supple Respiratory: clear to auscultation bilaterally,  no wheezing, no crackles. Normal respiratory effort. No accessory muscle use.  Cardiovascular: tachycardia, Regular rhythm, no murmurs / rubs / gallops. No extremity edema. 2+ pedal pulses. No carotid bruits.  Abdomen: no tenderness, no masses palpated. No hepatosplenomegaly. Bowel sounds positive.  Musculoskeletal: atrophic and wasted muscles Skin: stage 4 sacral decubitus ulcer covered with feces, strong odor, necrotic tissue, no bleeding, no active purulent discharge Neurologic: not following commands, not able to assess Psychiatric: advanced dementia, aphasia, not able to assess      Labs on Admission: I have personally reviewed following labs and imaging studies  CBC: Recent Labs  Lab 02/06/18 1226  WBC 15.6*  NEUTROABS 13.5*  HGB 12.6  HCT 39.9  MCV 103.6*  PLT 867   Basic Metabolic Panel: Recent Labs  Lab 02/06/18 1226  NA 164*  K 4.5  CL 125*  CO2 28  GLUCOSE 142*  BUN 59*  CREATININE 1.04*  CALCIUM 10.3   GFR: CrCl cannot be calculated (Unknown ideal weight.). Liver Function Tests: Recent Labs  Lab 02/06/18 1226  AST 44*  ALT 35  ALKPHOS 87  BILITOT 0.8  PROT 6.9  ALBUMIN 2.3*   No results for input(s): LIPASE, AMYLASE in the last 168 hours. No results for input(s): AMMONIA in the last 168 hours. Coagulation Profile: No results for input(s): INR, PROTIME in the last 168 hours. Cardiac Enzymes: No results for input(s): CKTOTAL, CKMB, CKMBINDEX, TROPONINI in the last 168 hours. BNP (last 3 results) No results for input(s): PROBNP in the last 8760 hours. HbA1C: No results for input(s): HGBA1C in the last 72 hours. CBG: No results for input(s): GLUCAP in the last 168 hours. Lipid Profile: No results for input(s): CHOL, HDL, LDLCALC, TRIG, CHOLHDL, LDLDIRECT in the last 72 hours. Thyroid Function Tests: No results for input(s): TSH, T4TOTAL, FREET4, T3FREE, THYROIDAB in the last 72 hours. Anemia Panel: No results for input(s): VITAMINB12,  FOLATE, FERRITIN, TIBC, IRON, RETICCTPCT in the last 72 hours. Urine analysis:    Component Value Date/Time   COLORURINE AMBER (A) 02/06/2018 1500   APPEARANCEUR HAZY (A) 02/06/2018 1500   LABSPEC 1.019 02/06/2018 1500   PHURINE 5.0 02/06/2018 1500   GLUCOSEU NEGATIVE 02/06/2018 1500   HGBUR NEGATIVE 02/06/2018 1500   BILIRUBINUR NEGATIVE 02/06/2018 1500   KETONESUR NEGATIVE 02/06/2018 1500   PROTEINUR NEGATIVE 02/06/2018 1500   UROBILINOGEN 4.0 (H) 11/11/2014 2320   NITRITE NEGATIVE 02/06/2018 1500   LEUKOCYTESUR NEGATIVE 02/06/2018 1500    Radiological Exams on Admission: Dg Pelvis 1-2 Views  Result Date: 02/06/2018 EXAM: PELVIS - 1-2 VIEW COMPARISON:  CT, 04/08/2016 FINDINGS: No fracture.  No bone lesion.  Bones are demineralized. Hip joints, SI joints and symphysis pubis are normally spaced and aligned. No arthropathic changes. There are no areas of bone resorption to suggest osteomyelitis. IMPRESSION: No fracture, dislocation or evidence of osteomyelitis. Electronically Signed   By: Lajean Manes M.D.   On: 02/06/2018 13:56   Dg Sacrum/coccyx  Result Date: 02/06/2018 CLINICAL DATA:  Sacral wound/pt has dementia/unable to obtain any history from pt-pt is from a nursing home. EXAM: SACRUM AND COCCYX - 2+ VIEW COMPARISON:  CT, 04/08/2016 FINDINGS: No fracture or bone lesion. No areas of bone resorption are noted to suggest osteomyelitis. Hip joints, SI joints and symphysis pubis are normally aligned. IMPRESSION: No fracture or evidence of osteomyelitis. Electronically Signed   By: Lajean Manes M.D.   On: 02/06/2018 13:55    EKG: Independently reviewed.   Assessment/Plan Principal Problem:   Decubitus ulcer of sacral region, stage 4 (HCC) Active Problems:   Dementia   Dehydration   Hypernatremia   Severe protein-calorie malnutrition (HCC)   Hypoalbuminemia   Lactic acid increased   Leukocytosis    Plan: -admit to telemetry monitoring while correcting hypernatremia -will  empirically treat this stage 4 sacral decubitus ulcer with iv vancomycin and Rocephin+Flaygl (allergic to PCN, discussed with pharmacy) to cover gram positive, negative, and anaerobics due to fecal contamination; will check ESR and CRP stat; if highly elevated, despite Xray did not reveal osteomyelitis, will likely have CT with contrast to further evaluate if osteomyelitis exists or not (not sure if this patient will remain still for MRI); this wound will need surgical debridement as well. I have listed general surgery please confirm in am. -repeat lactic acid and  wbc in am -pt is extremely demented and probably has no water access at [facility]", will encourage water drink and will start 1/2NS to lower her Na level -nutrition consult for her severe malnutrition -palliative care consult given advanced dementia, poor quality of life, severe dehydration and malnutrition, and stage 4 decubitus ulcer likely with infection. Need code status and goal of care discussion.  DVT prophylaxis: heparin [place]  Code Status: not able to discuss due to her advanced dementia, her last code status was DNR, will continue this for now  Family Communication: no family available  Disposition Plan: back to "family care home" Consults called: nutrition, palliative care, general sugery Admission status: tele   Paticia Stack MD [facility] Pager [phone]  If 7PM-7AM, please contact night-coverage [email or web address] Password [email or web address]  02/06/2018, 5:10 PM

## 2018-02-06 NOTE — ED Provider Notes (Addendum)
[facility] EMERGENCY DEPARTMENT Provider Note   CSN: [id number] Arrival date & time: 02/06/18  1050     History   Chief Complaint Chief Complaint  Patient presents with  . Pressure Ulcer    HPI Alexandra [name] is a 84 y.o. female.  The history is provided by the EMS personnel and a caregiver. The history is limited by the condition of the patient (Hx Dementia).  Pt was seen at 1155. Per EMS and pt's Home Health staff: [place] staff concerned regarding pt's chronic sacral decub, so they sent pt to ED for evaluation. Pt has significant hx of dementia. No reported fevers, vomiting/diarrhea, falls.     Past Medical History:  Diagnosis Date  . Breast cancer ([place])   . Cataract   . Chronic neck and back pain   . Dementia   . Dementia   . Hyperlipidemia   . IBS (irritable bowel syndrome)     Patient Active Problem List   Diagnosis Date Noted  . Dehydration 02/06/2018  . Hypernatremia 02/06/2018  . Severe protein-calorie malnutrition ([place]) 02/06/2018  . Hypoalbuminemia 02/06/2018  . Lactic acid increased 02/06/2018  . Leukocytosis 02/06/2018  . Decubitus ulcer of sacral region, stage 4 ([place]) 02/06/2018  . Dehydration with hypernatremia 02/06/2018  . Palliative care encounter   . Goals of care, counseling/discussion   . DNR (do not resuscitate) discussion   . Transaminitis 04/08/2016  . Fall at home 07/12/2015  . Dementia 07/12/2015  . Dyslipidemia 07/12/2015  . RBBB-no old EKGs 07/12/2015  . Bradycardia-HR 52 on adm EKG 07/12/2015  . Elevated troponin 07/11/2015  . NSTEMI (non-ST elevated myocardial infarction) ([place]) 07/11/2015  . Elevated LFTs   . Altered mental status 11/12/2014  . Gastroenteritis 11/12/2014  . Fever 11/12/2014  . LEUKOPENIA, MILD 04/04/2009  . TRANSAMINASES, SERUM, ELEVATED 04/04/2009  . GRIEF REACTION 09/06/2008  . DECREASED HEARING 02/11/2008  . SPINAL STENOSIS 02/18/2007  . History of breast cancer 01/15/2007  . ANXIETY 01/15/2007  .  PERIPHERAL NEUROPATHY 01/15/2007  . CATARACT NOS 01/15/2007  . ALLERGIC RHINITIS 01/15/2007  . ASTHMA 01/15/2007  . GERD 01/15/2007  . CONSTIPATION 01/15/2007  . IBS 01/15/2007  . ARTHRITIS 01/15/2007    Past Surgical History:  Procedure Laterality Date  . MASTECTOMY       OB History   None      Home Medications    Prior to Admission medications   Medication Sig Start Date End Date Taking? Authorizing Provider  acetaminophen (TYLENOL) 650 MG CR tablet Take 650 mg by mouth every 4 (four) hours as needed for pain or fever.   Yes [provider]  alum & mag hydroxide-simeth (MAALOX/MYLANTA) 200-200-20 MG/5ML suspension Take 30 mLs by mouth every 6 (six) hours as needed for indigestion or heartburn.   Yes [provider]  anti-nausea (EMETROL) solution Take 10 mLs by mouth every 15 (fifteen) minutes as needed for nausea or vomiting.   Yes [provider]  dextromethorphan-guaiFENesin (MUCINEX DM) 30-600 MG 12hr tablet Take 1 tablet by mouth at bedtime.   Yes [provider]  donepezil (ARICEPT) 10 MG tablet Take 1 tablet (10 mg total) by mouth at bedtime. 11/16/14  Yes Lucia Gaskins, MD  Multiple Vitamin (MULTIVITAMIN WITH MINERALS) TABS tablet Take 1 tablet by mouth daily.   Yes [provider]  omeprazole (PRILOSEC) 20 MG capsule Take 20 mg by mouth daily. 10/20/14  Yes [provider]  potassium chloride SA (K-DUR,KLOR-CON) 20 MEQ tablet Take 1  tablet (20 mEq total) by mouth daily. 04/12/16  Yes Lucia Gaskins, MD  Dextromethorphan-Guaifenesin (TUSSIN DM) 10-100 MG/5ML liquid Take 15 mLs by mouth every 4 (four) hours as needed (for simple cough). Do not exceed 4 doses in 24 hours.    [provider]  hydroxypropyl methylcellulose / hypromellose (ISOPTO TEARS / GONIOVISC) 2.5 % ophthalmic solution 2 drops as needed (for eye lid irritation).    [provider]  liver oil-zinc oxide (DESITIN) 40 % ointment Apply  1 application topically as needed (for urine burn).    [provider]  magnesium hydroxide (MILK OF MAGNESIA) 400 MG/5ML suspension Take 30 mLs by mouth daily as needed for mild constipation.    [provider]  neomycin-bacitracin-polymyxin (NEOSPORIN) ointment Apply 1 application topically as needed for wound care. Wash affected area with soap and warm water first; then dry. Apply a small amount as needed for cuts or abrasions.    [provider]  phenol (CHLORASEPTIC) 1.4 % LIQD Use as directed 1 spray in the mouth or throat as needed for throat irritation / pain.    [provider]    Family History Family History  Problem Relation Age of Onset  . Colon cancer Unknown        unknown    Social History Social History   Tobacco Use  . Smoking status: Former Research scientist (life sciences)  . Smokeless tobacco: Never Used  Substance Use Topics  . Alcohol use: No    Alcohol/week: 0.0 oz    Comment: in the past per patient  . Drug use: No     Allergies   Aspirin; Chocolate; and Penicillins   Review of Systems Review of Systems  Unable to perform ROS: Dementia     Physical Exam Updated Vital Signs BP (!) 102/56   Pulse (!) 104   Temp (!) 97 F (36.1 C) (Rectal)   Resp 18   Wt 36.3 kg (80 lb)   SpO2 98%   BMI 15.62 kg/m    Patient Vitals for the past 24 hrs:  BP Temp Temp src Pulse Resp SpO2 Weight  02/06/18 1634 (!) 102/56 - - - - - -  02/06/18 1630 - - - (!) 104 - 98 % -  02/06/18 1624 97/62 - - - - - -  02/06/18 1545 134/68 - - 86 18 98 % -  02/06/18 1440 - - - 60 - - -  02/06/18 1433 (!) 172/91 - - - - - -  02/06/18 1212 - - - (!) 101 - 98 % -  02/06/18 1136 - - - 86 18 100 % -  02/06/18 1110 (!) 109/95 (!) 97 F (36.1 C) Rectal (!) 111 (!) 32 100 % -  02/06/18 1059 - - - - - - 36.3 kg (80 lb)     Physical Exam 1200: Physical examination:  Nursing notes reviewed; Vital signs and O2 SAT reviewed;  Constitutional:  Cachectic. In no acute  distress; Head:  Normocephalic, atraumatic; Eyes: EOMI, PERRL, No scleral icterus; ENMT: Mouth and pharynx normal, Mucous membranes dry; Neck: Supple, Full range of motion, No lymphadenopathy; Cardiovascular: Regular rate and rhythm, No gallop; Respiratory: Breath sounds clear & equal bilaterally, No wheezes. Normal respiratory effort/excursion; Chest: Nontender, Movement normal; Abdomen: Soft, Nontender, Nondistended, Normal bowel sounds; Genitourinary: No CVA tenderness; Spine:  No midline CS, TS, LS tenderness.  +eschar over wound to right buttock. +large decub left buttock with visualized SQ tissue, no purulent drainage, no surrounding erythema. +yellow stool  in diaper and on buttocks..;;  Extremities: Peripheral pulses normal, No tenderness, No edema, No calf edema or asymmetry.; Neuro: Awake/alert, eyes open, does not speak. No facial droop. Moves all extremities spontaneously on stretcher without apparent gross focal motor deficits.; Skin: Color normal, Warm, Dry.   ED Treatments / Results  Labs (all labs ordered are listed, but only abnormal results are displayed)   EKG None  Radiology   Procedures Procedures (including critical care time)  Medications Ordered in ED Medications  0.9 %  sodium chloride infusion ( Intravenous New Bag/Given 02/06/18 1443)  sodium chloride 0.9 % bolus 250 mL (0 mLs Intravenous Stopped 02/06/18 1528)  sodium chloride 0.9 % bolus 250 mL (0 mLs Intravenous Stopped 02/06/18 1528)  sodium chloride 0.9 % bolus 500 mL (0 mLs Intravenous Stopped 02/06/18 1610)  vancomycin (VANCOCIN) IVPB 1000 mg/200 mL premix (1,000 mg Intravenous New Bag/Given 02/06/18 1543)     Initial Impression / Assessment and Plan / ED Course  I have reviewed the triage vital signs and the nursing notes.  Pertinent labs & imaging results that were available during my care of the patient were reviewed by me and considered in my medical decision making (see chart for  details).  MDM Reviewed: previous chart, nursing note and vitals Reviewed previous: labs Interpretation: labs and x-ray Total time providing critical care: 30-74 minutes. This excludes time spent performing separately reportable procedures and services. Consults: admitting MD   CRITICAL CARE Performed by: Alfonzo Feller Total critical care time: 35 minutes Critical care time was exclusive of separately billable procedures and treating other patients. Critical care was necessary to treat or prevent imminent or life-threatening deterioration. Critical care was time spent personally by me on the following activities: development of treatment plan with patient and/or surrogate as well as nursing, discussions with consultants, evaluation of patient's response to treatment, examination of patient, obtaining history from patient or surrogate, ordering and performing treatments and interventions, ordering and review of laboratory studies, ordering and review of radiographic studies, pulse oximetry and re-evaluation of patient's condition.  Results for orders placed or performed during the hospital encounter of 02/06/18  Comprehensive metabolic panel  Result Value Ref Range   Sodium 164 (HH) 135 - 145 mmol/L   Potassium 4.5 3.5 - 5.1 mmol/L   Chloride 125 (H) 98 - 111 mmol/L   CO2 28 22 - 32 mmol/L   Glucose, Bld 142 (H) 70 - 99 mg/dL   BUN 59 (H) 8 - 23 mg/dL   Creatinine, Ser 1.04 (H) 0.44 - 1.00 mg/dL   Calcium 10.3 8.9 - 10.3 mg/dL   Total Protein 6.9 6.5 - 8.1 g/dL   Albumin 2.3 (L) 3.5 - 5.0 g/dL   AST 44 (H) 15 - 41 U/L   ALT 35 0 - 44 U/L   Alkaline Phosphatase 87 38 - 126 U/L   Total Bilirubin 0.8 0.3 - 1.2 mg/dL   GFR calc non Af Amer 46 (L) >60 mL/min   GFR calc Af Amer 53 (L) >60 mL/min   Anion gap 11 5 - 15  Lactic acid, plasma  Result Value Ref Range   Lactic Acid, Venous 3.1 (HH) 0.5 - 1.9 mmol/L  Lactic acid, plasma  Result Value Ref Range   Lactic Acid, Venous  3.9 (HH) 0.5 - 1.9 mmol/L  CBC with Differential  Result Value Ref Range   WBC 15.6 (H) 4.0 - 10.5 K/uL   RBC 3.85 (L) 3.87 - 5.11 MIL/uL   Hemoglobin 12.6  12.0 - 15.0 g/dL   HCT 39.9 36.0 - 46.0 %   MCV 103.6 (H) 78.0 - 100.0 fL   MCH 32.7 26.0 - 34.0 pg   MCHC 31.6 30.0 - 36.0 g/dL   RDW 14.3 11.5 - 15.5 %   Platelets 185 150 - 400 K/uL   Neutrophils Relative % 87 %   Neutro Abs 13.5 (H) 1.7 - 7.7 K/uL   Lymphocytes Relative 7 %   Lymphs Abs 1.1 0.7 - 4.0 K/uL   Monocytes Relative 6 %   Monocytes Absolute 0.9 0.1 - 1.0 K/uL   Eosinophils Relative 0 %   Eosinophils Absolute 0.0 0.0 - 0.7 K/uL   Basophils Relative 0 %   Basophils Absolute 0.0 0.0 - 0.1 K/uL  Urinalysis, Routine w reflex microscopic  Result Value Ref Range   Color, Urine AMBER (A) YELLOW   APPearance HAZY (A) CLEAR   Specific Gravity, Urine 1.019 1.005 - 1.030   pH 5.0 5.0 - 8.0   Glucose, UA NEGATIVE NEGATIVE mg/dL   Hgb urine dipstick NEGATIVE NEGATIVE   Bilirubin Urine NEGATIVE NEGATIVE   Ketones, ur NEGATIVE NEGATIVE mg/dL   Protein, ur NEGATIVE NEGATIVE mg/dL   Nitrite NEGATIVE NEGATIVE   Leukocytes, UA NEGATIVE NEGATIVE   Dg Pelvis 1-2 Views Result Date: 02/06/2018 EXAM: PELVIS - 1-2 VIEW COMPARISON:  CT, 04/08/2016 FINDINGS: No fracture.  No bone lesion.  Bones are demineralized. Hip joints, SI joints and symphysis pubis are normally spaced and aligned. No arthropathic changes. There are no areas of bone resorption to suggest osteomyelitis. IMPRESSION: No fracture, dislocation or evidence of osteomyelitis. Electronically Signed   By: Lajean Manes M.D.   On: 02/06/2018 13:56   Dg Sacrum/coccyx Result Date: 02/06/2018 CLINICAL DATA:  Sacral wound/pt has dementia/unable to obtain any history from pt-pt is from a nursing home. EXAM: SACRUM AND COCCYX - 2+ VIEW COMPARISON:  CT, 04/08/2016 FINDINGS: No fracture or bone lesion. No areas of bone resorption are noted to suggest osteomyelitis. Hip joints, SI  joints and symphysis pubis are normally aligned. IMPRESSION: No fracture or evidence of osteomyelitis. Electronically Signed   By: Lajean Manes M.D.   On: 02/06/2018 13:55    1425:   Pt appears clinically dehydrated, sodium/chloride and BUN/Cr elevated. IVF 30mg /kg bolus given for elevated lactic acid (of note, 2nd lactic acid was drawn early). IV abx started for possible sacral decub infection. Pt remains afebrile. T/C returned from [facility]. Crisoforo Oxford, case discussed, including:  HPI, pertinent PM/SHx, VS/PE, dx testing, ED course and treatment:  Agreeable to admit.     Final Clinical Impressions(s) / ED Diagnoses   Final diagnoses:  Hypernatremia  Dehydration  Pressure injury of skin of sacral region, unspecified injury stage    ED Discharge Orders    None         Francine Graven, DO 02/08/18 [id number]

## 2018-02-06 NOTE — ED Notes (Signed)
Date and time results received: 02/06/18 2:45 PM  (use smartphrase ".now" to insert current time)  Test: Lactic Acid Critical Value: 3.9  Name of Provider Notified: [place]  Orders Received? Or Actions Taken?: Orders Received - See Orders for details

## 2018-02-06 NOTE — ED Triage Notes (Signed)
Pt brought over by EMS from a home on [address]. Pt sent over per EMS due to bedsore and supposedly get a MRI per Dr Cindie Laroche. Pt has dementia and contracture. Large amount of stool on pts brief

## 2018-02-06 NOTE — ED Notes (Signed)
CRITICAL VALUE ALERT  Critical Value:  Sodium 165, Lactic Acid 3.1  Date & Time Notied:  02/06/18 1332  Provider Notified: Dr. Thurnell Garbe  Orders Received/Actions taken: EDP notified

## 2018-02-07 DIAGNOSIS — L89154 Pressure ulcer of sacral region, stage 4: Principal | ICD-10-CM

## 2018-02-07 LAB — CBC
HEMATOCRIT: 34.9 % — AB (ref 36.0–46.0)
HEMOGLOBIN: 10.9 g/dL — AB (ref 12.0–15.0)
MCH: 32.1 pg (ref 26.0–34.0)
MCHC: 31.2 g/dL (ref 30.0–36.0)
MCV: 102.6 fL — ABNORMAL HIGH (ref 78.0–100.0)
Platelets: 180 10*3/uL (ref 150–400)
RBC: 3.4 MIL/uL — AB (ref 3.87–5.11)
RDW: 14.3 % (ref 11.5–15.5)
WBC: 12.1 10*3/uL — ABNORMAL HIGH (ref 4.0–10.5)

## 2018-02-07 LAB — BASIC METABOLIC PANEL
ANION GAP: 7 (ref 5–15)
BUN: 52 mg/dL — ABNORMAL HIGH (ref 8–23)
CALCIUM: 9.4 mg/dL (ref 8.9–10.3)
CO2: 24 mmol/L (ref 22–32)
Chloride: 130 mmol/L — ABNORMAL HIGH (ref 98–111)
Creatinine, Ser: 0.83 mg/dL (ref 0.44–1.00)
GFR calc non Af Amer: >60 mL/min (ref >60)
GLUCOSE: 100 mg/dL — AB (ref 70–99)
POTASSIUM: 4.5 mmol/L (ref 3.5–5.1)
Sodium: 161 mmol/L (ref 135–145)

## 2018-02-07 LAB — MRSA PCR SCREENING: MRSA BY PCR: NEGATIVE

## 2018-02-07 LAB — LACTIC ACID, PLASMA: LACTIC ACID, VENOUS: 1.6 mmol/L (ref 0.5–1.9)

## 2018-02-07 MED ORDER — COLLAGENASE 250 UNIT/GM EX OINT
TOPICAL_OINTMENT | Freq: Two times a day (BID) | CUTANEOUS | Status: DC
  Administered 2018-02-07 – 2018-02-08 (×3): via TOPICAL
  Administered 2018-02-09: 1 via TOPICAL
  Administered 2018-02-09 – 2018-02-13 (×8): via TOPICAL
  Filled 2018-02-07 (×2): qty 30

## 2018-02-07 MED ORDER — JUVEN PO PACK
1.0000 | PACK | Freq: Two times a day (BID) | ORAL | Status: DC
  Administered 2018-02-07 – 2018-02-13 (×13): 1 via ORAL
  Filled 2018-02-07 (×13): qty 1

## 2018-02-07 MED ORDER — DEXTROSE-NACL 5-0.45 % IV SOLN
INTRAVENOUS | Status: DC
  Administered 2018-02-07 – 2018-02-08 (×3): via INTRAVENOUS

## 2018-02-07 MED ORDER — ADULT MULTIVITAMIN LIQUID CH
15.0000 mL | Freq: Every day | ORAL | Status: DC
Start: 2018-02-07 — End: 2018-02-13
  Administered 2018-02-08 – 2018-02-13 (×6): 15 mL via ORAL
  Filled 2018-02-07 (×10): qty 15

## 2018-02-07 NOTE — Progress Notes (Addendum)
Initial Nutrition Assessment  DOCUMENTATION CODES:  Underweight  INTERVENTION:  Continue Ensure Enlive po BID, each supplement provides 350 kcal and 20 grams of protein  Will order Juven supplement which contains CaHMB, Arginine, Glutamine and collagen to promote wound healing/tissue granulation.   Change Mvi to liquid mvi to allow for easier administration  RD not on site to begin calorie count. Spoke with RN and asked to hang calorie count envelope on the patient's door and initiate count  Apprised Diet ambassador of calorie count  NUTRITION DIAGNOSIS:  Increased nutrient needs related to wound healing as evidenced by estimated nutritional requirements for this condition  GOAL:  Patient will meet greater than or equal to 90% of their needs  MONITOR:  PO intake, Supplement acceptance, Diet advancement, Labs, Weight trends, I & O's  REASON FOR ASSESSMENT:  Consult Assessment of nutrition requirement/status, Calorie Count  ASSESSMENT:  84 y/o female PMHx Advanced dementia, IBS, HLD, suspected severe malnutrition. Referred to ED by her PCP due to possible osteomyelitis of a stage 4 sacral decubitus ulcer. Imaging did not show osteomyelitis, though strongly suspected to have infection. Also with hypernatremia (164). Admitted for management.   RD operating remotely on weekends. Per chart, patient came from care home and appears there is concern the patient was not receiving adequate access to fluids with a sodium of 164. She has several wounds. Will likely need debridement.   Patient has minimal chart history available. There is direct report of how the patient has been eating or any symptoms of GI distress that may be hindering intake. Unclear if she was on modified diet textures at care home. On soft diet now, there is no documented intake as of yet.   Had been admitted to [facility] approximately 22 months ago. At that time, she weighed 80 lbs. Though she has not lost weight, BMI still  suggest she is chronically underweight.   Will initiate calorie count per MD request. Continue Ensure for kcal/pro. Change to liquid mvi + min/ juven to try to help with wound healing.   Spoke with pt RN and dietary staff to apprise of calorie count. RN noted patient is refusing to eat and has only consumed a OJ cup. Palliative consult appropriate.   Physical Exam: Unable to assess  Labs: Na 164->161, WBC:12.1, Albumin: 2.3 Meds: Aricept, Colace, Ensure Enlive BID, Aricept, MVI with min, Senokot, PPI, IV abx, IVF  Recent Labs  Lab 02/06/18 1226 02/07/18 0544  NA 164* 161*  K 4.5 4.5  CL 125* 130*  CO2 28 24  BUN 59* 52*  CREATININE 1.04* 0.83  CALCIUM 10.3 9.4  GLUCOSE 142* 100*  ; NUTRITION - FOCUSED PHYSICAL EXAM: Unable to conduct at this time.   Diet Order:   Diet Order           DIET SOFT Room service appropriate? Yes; Fluid consistency: Thin  Diet effective now         EDUCATION NEEDS:  No education needs have been identified at this time  Skin:  Skin Integrity Issues: Stage I: R lateral ankle Stage II: L hip Stage III: Sacrum Unstageable: R hip  Last BM:  Unknown  Height:  Ht Readings from Last 1 Encounters:  02/06/18 5' (1.524 m)   Weight:  Wt Readings from Last 1 Encounters:  02/06/18 82 lb 3.7 oz (37.3 kg)   Wt Readings from Last 10 Encounters:  02/06/18 82 lb 3.7 oz (37.3 kg)  04/10/16 80 lb 4.8 oz (36.4 kg)  07/12/15 93 lb 14.7 oz (42.6 kg)  11/12/14 110 lb 14.4 oz (50.3 kg)  11/14/14 110 lb (49.9 kg)   Ideal Body Weight:     BMI:  Body mass index is 16.06 kg/m.  Estimated Nutritional Needs:  Kcal:  >1500 kcals (40g/kg bw) Protein:  >75g pro (2g/kg bw) Fluid:  >1.5 L (1 ml/kcal)  Burtis Junes RD, LDN, CNSC Clinical Nutrition Available Tues-Sat via Pager: [phone] 02/07/2018 9:09 AM

## 2018-02-07 NOTE — Progress Notes (Signed)
[facility]  Full note to follow.  Patient with stage 4 sacral wound with necrotic tissue. Debridement at the bedside performed. predebridement    Post debridement    Will follow and see if need additional debridment.  L and right hip sores as well. Would just do the pressure dressings. Right H with dry eschar.  Santyl BID with damp to dry gauze packing for sacrum.  Curlene Labrum, MD [facility] [address] [place], [place] [address] [phone] (office)

## 2018-02-07 NOTE — Progress Notes (Signed)
Patient on vancomycin and Rocephin and Flagyl to cover the positives negatives and anaerobes Dr. Constance [name] has debrided his sacral ulcer she has significant hypernatremia's advanced dementia cachexia and hypoproteinemia.  Protein supplements ordered in the form of Ensure and another supplement fluids have been changed to D5 half will follow sodium and be met daily x3 days will get air soft mattress. Alexandra [name] QMG:[id number] DOB: 11/05/1925 DOA: 02/06/2018 PCP: Alexandra Gaskins, MD   Physical Exam: Blood pressure 101/65, pulse 75, temperature 99.1 F (37.3 C), temperature source Rectal, resp. rate 20, height 5' (1.524 m), weight 37.3 kg (82 lb 3.7 oz), SpO2 97 %.  Lungs no rales wheeze or rhonchi appreciable heart regular rhythm no S3-S4 no heaves thrills rubs   Investigations:  Recent Results (from the past 240 hour(s))  MRSA PCR Screening     Status: None   Collection Time: 02/06/18 11:10 PM  Result Value Ref Range Status   MRSA by PCR NEGATIVE NEGATIVE Final    Comment:        The GeneXpert MRSA Assay (FDA approved for NASAL specimens only), is one component of a comprehensive MRSA colonization surveillance program. It is not intended to diagnose MRSA infection nor to guide or monitor treatment for MRSA infections. Performed at [name], [address], [place], [place] [address]      Basic Metabolic Panel: Recent Labs    02/06/18 1226 02/07/18 0544  NA 164* 161*  K 4.5 4.5  CL 125* 130*  CO2 28 24  GLUCOSE 142* 100*  BUN 59* 52*  CREATININE 1.04* 0.83  CALCIUM 10.3 9.4   Liver Function Tests: Recent Labs    02/06/18 1226  AST 44*  ALT 35  ALKPHOS 87  BILITOT 0.8  PROT 6.9  ALBUMIN 2.3*     CBC: Recent Labs    02/06/18 1226 02/07/18 0544  WBC 15.6* 12.1*  NEUTROABS 13.5*  --   HGB 12.6 10.9*  HCT 39.9 34.9*  MCV 103.6* 102.6*  PLT 185 180    Dg Pelvis 1-2 Views  Result Date: 02/06/2018 EXAM: PELVIS - 1-2 VIEW COMPARISON:  CT, 04/08/2016  FINDINGS: No fracture.  No bone lesion.  Bones are demineralized. Hip joints, SI joints and symphysis pubis are normally spaced and aligned. No arthropathic changes. There are no areas of bone resorption to suggest osteomyelitis. IMPRESSION: No fracture, dislocation or evidence of osteomyelitis. Electronically Signed   By: Alexandra [name] M.D.   On: 02/06/2018 13:56   Dg Sacrum/coccyx  Result Date: 02/06/2018 CLINICAL DATA:  Sacral wound/pt has dementia/unable to obtain any history from pt-pt is from a nursing home. EXAM: SACRUM AND COCCYX - 2+ VIEW COMPARISON:  CT, 04/08/2016 FINDINGS: No fracture or bone lesion. No areas of bone resorption are noted to suggest osteomyelitis. Hip joints, SI joints and symphysis pubis are normally aligned. IMPRESSION: No fracture or evidence of osteomyelitis. Electronically Signed   By: Alexandra [name] M.D.   On: 02/06/2018 13:55      Medications:   Impression:  Principal Problem:   Decubitus ulcer of sacral region, stage 4 (H[facility]) Active Problems:   Dementia   Dehydration   Hypernatremia   Severe protein-calorie malnutrition ([facility])   Hypoalbuminemia   Lactic acid increased   Leukocytosis   Wound infection     Plan: D5 half-normal saline monitor be met daily for 3 days continue 3 antibiotics vancomycin Rocephin and Flagyl.  Continue protein supplementation patient is swallowing gnosis is abysmal  Consultants: General surgery  Procedures   Antibiotics: Vancomycin Rocephin and Flagyl          Time spent: 30 minutes   LOS: 1 day   Alexandra [name] M   02/07/2018, 10:22 AM

## 2018-02-07 NOTE — Consult Note (Addendum)
[facility] Consult  Reason for Consult: Sacral Decubitus UIcer  Referring Physician: Dr. Cindie Laroche   Chief Complaint    Pressure Ulcer      Alexandra [name] is a 84 y.o. female.  HPI: Alexandra [name] is a 52 with with dementia who lives a a group home and was brought in from her PCP with concern for infection coming from her sacral wound. PCP wanted MRI to assess the sacral wound.  Xray shows no osteomyelitis.  Unsure of duration of the wound or associated symptoms as patient is not speaking and has dementia. No family at bedside.  Past Medical History:  Diagnosis Date  . Breast cancer ([place])   . Cataract   . Chronic neck and back pain   . Dementia   . Dementia   . Hyperlipidemia   . IBS (irritable bowel syndrome)     Past Surgical History:  Procedure Laterality Date  . MASTECTOMY      Family History  Problem Relation Age of Onset  . Colon cancer Unknown        unknown    Social History   Tobacco Use  . Smoking status: Former Research scientist (life sciences)  . Smokeless tobacco: Never Used  Substance Use Topics  . Alcohol use: No    Alcohol/week: 0.0 oz    Comment: in the past per patient  . Drug use: No    Medications:  I have reviewed the patient's current medications. Prior to Admission:  Medications Prior to Admission  Medication Sig Dispense Refill Last Dose  . acetaminophen (TYLENOL) 650 MG CR tablet Take 650 mg by mouth every 4 (four) hours as needed for pain or fever.   unknown  . alum & mag hydroxide-simeth (MAALOX/MYLANTA) 200-200-20 MG/5ML suspension Take 30 mLs by mouth every 6 (six) hours as needed for indigestion or heartburn.   unknown  . anti-nausea (EMETROL) solution Take 10 mLs by mouth every 15 (fifteen) minutes as needed for nausea or vomiting.   unknown  . dextromethorphan-guaiFENesin (MUCINEX DM) 30-600 MG 12hr tablet Take 1 tablet by mouth at bedtime.   02/05/2018 at Unknown time  . donepezil (ARICEPT) 10 MG tablet Take 1 tablet (10 mg total) by mouth at  bedtime. 30 tablet 3 02/05/2018 at Unknown time  . Multiple Vitamin (MULTIVITAMIN WITH MINERALS) TABS tablet Take 1 tablet by mouth daily.   02/06/2018 at Unknown time  . omeprazole (PRILOSEC) 20 MG capsule Take 20 mg by mouth daily.   02/06/2018 at Unknown time  . potassium chloride SA (K-DUR,KLOR-CON) 20 MEQ tablet Take 1 tablet (20 mEq total) by mouth daily. 30 tablet 0 02/06/2018 at Unknown time  . Dextromethorphan-Guaifenesin (TUSSIN DM) 10-100 MG/5ML liquid Take 15 mLs by mouth every 4 (four) hours as needed (for simple cough). Do not exceed 4 doses in 24 hours.   unknown  . hydroxypropyl methylcellulose / hypromellose (ISOPTO TEARS / GONIOVISC) 2.5 % ophthalmic solution 2 drops as needed (for eye lid irritation).   unknown  . liver oil-zinc oxide (DESITIN) 40 % ointment Apply 1 application topically as needed (for urine burn).   unknown  . magnesium hydroxide (MILK OF MAGNESIA) 400 MG/5ML suspension Take 30 mLs by mouth daily as needed for mild constipation.   unknown  . neomycin-bacitracin-polymyxin (NEOSPORIN) ointment Apply 1 application topically as needed for wound care. Wash affected area with soap and warm water first; then dry. Apply a small amount as needed for cuts or abrasions.   unknown  . phenol (CHLORASEPTIC) 1.4 %  LIQD Use as directed 1 spray in the mouth or throat as needed for throat irritation / pain.   unknowndestin   Scheduled: . collagenase   Topical BID  . docusate sodium  100 mg Oral BID  . donepezil  10 mg Oral QHS  . feeding supplement (ENSURE ENLIVE)  237 mL Oral BID BM  . heparin  5,000 Units Subcutaneous Q8H  . multivitamin  15 mL Oral Daily  . nutrition supplement (JUVEN)  1 packet Oral BID BM  . pantoprazole  40 mg Oral Daily  . senna  1 tablet Oral BID   Continuous: . cefTRIAXone (ROCEPHIN)  IV Stopped (02/06/18 2130)  . dextrose 5 % and 0.45% NaCl 75 mL/hr at 02/07/18 1003  . metronidazole Stopped (02/07/18 1103)  . [START ON 02/08/2018] vancomycin      PFX:[id number], magnesium hydroxide, ondansetron **OR** ondansetron (ZOFRAN) IV  Allergies  Allergen Reactions  . Aspirin     REACTION: "Messes up my heart and tells it to stop"  . Chocolate   . Penicillins     Has patient had a PCN reaction causing immediate rash, facial/tongue/throat swelling, SOB or lightheadedness with hypotension: unknown Has patient had a PCN reaction causing severe rash involving mucus membranes or skin necrosis: unknown Has patient had a PCN reaction that required hospitalization unknown Has patient had a PCN reaction occurring within the last 10 years: unknown If all of the above answers are "NO", then may proceed with Cephalosporin use. REACTION: Pains and feels it messes her up    ROS:  Review of systems not obtained due to patient factors.  Blood pressure 101/65, pulse 75, temperature (!) 97.5 F (36.4 C), temperature source Rectal, resp. rate 20, height 5' (1.524 m), weight 82 lb 3.7 oz (37.3 kg), SpO2 97 %. Physical Exam  Constitutional: No distress.  Cachetic, thin and frail  HENT:  Head: Normocephalic.  Temporal wasting  Eyes: Pupils are equal, round, and reactive to light.  Neck: Normal range of motion.  Cardiovascular: Normal rate.  Pulmonary/Chest: Effort normal.  Abdominal: Soft. She exhibits no distension. There is no tenderness.  Midline incision  Genitourinary:  Genitourinary Comments: Sacral wound with necrotic debride and sloughing, right hip with dry eschar, non boggy, left hip with some epidermis missing  Musculoskeletal: She exhibits no edema or deformity.  Neurological: She is alert.  Psychiatric:  Dementia, minimal speaking   Vitals reviewed. Pre debridement   Post debridement    right hip dry eschar, non boggy     Results: Results for orders placed or performed during the hospital encounter of 02/06/18 (from the past 48 hour(s))  Comprehensive metabolic panel     Status: Abnormal   Collection Time: 02/06/18  12:26 PM  Result Value Ref Range   Sodium 164 (HH) 135 - 145 mmol/L    Comment: CRITICAL RESULT CALLED TO, READ BACK BY AND VERIFIED WITH: WHITE M @ 1323 ON [id number] BY HENDDERSON L.    Potassium 4.5 3.5 - 5.1 mmol/L   Chloride 125 (H) 98 - 111 mmol/L   CO2 28 22 - 32 mmol/L   Glucose, Bld 142 (H) 70 - 99 mg/dL   BUN 59 (H) 8 - 23 mg/dL   Creatinine, Ser 1.04 (H) 0.44 - 1.00 mg/dL   Calcium 10.3 8.9 - 10.3 mg/dL   Total Protein 6.9 6.5 - 8.1 g/dL   Albumin 2.3 (L) 3.5 - 5.0 g/dL   AST 44 (H) 15 - 41 U/L   ALT 35 0 -  44 U/L   Alkaline Phosphatase 87 38 - 126 U/L   Total Bilirubin 0.8 0.3 - 1.2 mg/dL   GFR calc non Af Amer 46 (L) >60 mL/min   GFR calc Af Amer 53 (L) >60 mL/min    Comment: (NOTE) The eGFR has been calculated using the CKD EPI equation. This calculation has not been validated in all clinical situations. eGFR's persistently <60 mL/min signify possible Chronic Kidney Disease.    Anion gap 11 5 - 15    Comment: Performed at [facility], [address] [place], [place] [address]  Lactic acid, plasma     Status: Abnormal   Collection Time: 02/06/18 12:26 PM  Result Value Ref Range   Lactic Acid, Venous 3.1 (HH) 0.5 - 1.9 mmol/L    Comment: CRITICAL RESULT CALLED TO, READ BACK BY AND VERIFIED WITH: WHITE @ 1323 ON [id number] BY HENDERSON L. Performed at [facility], [address], [place], [place] [address]   CBC with Differential     Status: Abnormal   Collection Time: 02/06/18 12:26 PM  Result Value Ref Range   WBC 15.6 (H) 4.0 - 10.5 K/uL   RBC 3.85 (L) 3.87 - 5.11 MIL/uL   Hemoglobin 12.6 12.0 - 15.0 g/dL   HCT 39.9 36.0 - 46.0 %   MCV 103.6 (H) 78.0 - 100.0 fL   MCH 32.7 26.0 - 34.0 pg   MCHC 31.6 30.0 - 36.0 g/dL   RDW 14.3 11.5 - 15.5 %   Platelets 185 150 - 400 K/uL   Neutrophils Relative % 87 %   Neutro Abs 13.5 (H) 1.7 - 7.7 K/uL   Lymphocytes Relative 7 %   Lymphs Abs 1.1 0.7 - 4.0 K/uL   Monocytes Relative 6 %   Monocytes Absolute 0.9 0.1 - 1.0  K/uL   Eosinophils Relative 0 %   Eosinophils Absolute 0.0 0.0 - 0.7 K/uL   Basophils Relative 0 %   Basophils Absolute 0.0 0.0 - 0.1 K/uL    Comment: Performed at [facility], [address] [place], [place] [address]  Sedimentation rate     Status: Abnormal   Collection Time: 02/06/18 12:26 PM  Result Value Ref Range   Sed Rate 88 (H) 0 - 22 mm/hr    Comment: Performed at [facility], [address], [place], [place] [address]  Lactic acid, plasma     Status: Abnormal   Collection Time: 02/06/18  1:56 PM  Result Value Ref Range   Lactic Acid, Venous 3.9 (HH) 0.5 - 1.9 mmol/L    Comment: CRITICAL RESULT CALLED TO, READ BACK BY AND VERIFIED WITH: PATREW @ 1442 ON [id number] BY HENDERSON L. Performed at [facility], [address] [place], [place] [address]   Urinalysis, Routine w reflex microscopic     Status: Abnormal   Collection Time: 02/06/18  3:00 PM  Result Value Ref Range   Color, Urine AMBER (A) YELLOW    Comment: BIOCHEMICALS MAY BE AFFECTED BY COLOR   APPearance HAZY (A) CLEAR   Specific Gravity, Urine 1.019 1.005 - 1.030   pH 5.0 5.0 - 8.0   Glucose, UA NEGATIVE NEGATIVE mg/dL   Hgb urine dipstick NEGATIVE NEGATIVE   Bilirubin Urine NEGATIVE NEGATIVE   Ketones, ur NEGATIVE NEGATIVE mg/dL   Protein, ur NEGATIVE NEGATIVE mg/dL   Nitrite NEGATIVE NEGATIVE   Leukocytes, UA NEGATIVE NEGATIVE    Comment: Performed at [facility], [address], [place], [place] [address]  C-reactive protein  Status: Abnormal   Collection Time: 02/06/18  5:06 PM  Result Value Ref Range   CRP 24.6 (H) <1.0 mg/dL    Comment: Performed at [facility] [address] [place], [place] [address]  MRSA PCR Screening     Status: None   Collection Time: 02/06/18 11:10 PM  Result Value Ref Range   MRSA by PCR NEGATIVE NEGATIVE    Comment:        The GeneXpert MRSA Assay (FDA approved for NASAL specimens only), is one component of a comprehensive MRSA colonization surveillance  program. It is not intended to diagnose MRSA infection nor to guide or monitor treatment for MRSA infections. Performed at [facility], [address], [place], [place] [address]   Basic metabolic panel     Status: Abnormal   Collection Time: 02/07/18  5:44 AM  Result Value Ref Range   Sodium 161 (HH) 135 - 145 mmol/L    Comment: CRITICAL RESULT CALLED TO, READ BACK BY AND VERIFIED WITH: REGW @ 0702 ON [id number] BY HENDERSON L.    Potassium 4.5 3.5 - 5.1 mmol/L   Chloride 130 (H) 98 - 111 mmol/L   CO2 24 22 - 32 mmol/L   Glucose, Bld 100 (H) 70 - 99 mg/dL   BUN 52 (H) 8 - 23 mg/dL   Creatinine, Ser 0.83 0.44 - 1.00 mg/dL   Calcium 9.4 8.9 - 10.3 mg/dL   GFR calc non Af Amer >60 >60 mL/min   GFR calc Af Amer >60 >60 mL/min    Comment: (NOTE) The eGFR has been calculated using the CKD EPI equation. This calculation has not been validated in all clinical situations. eGFR's persistently <60 mL/min signify possible Chronic Kidney Disease.    Anion gap 7 5 - 15    Comment: Performed at [facility], [address] [place], [place] [address]  CBC     Status: Abnormal   Collection Time: 02/07/18  5:44 AM  Result Value Ref Range   WBC 12.1 (H) 4.0 - 10.5 K/uL   RBC 3.40 (L) 3.87 - 5.11 MIL/uL   Hemoglobin 10.9 (L) 12.0 - 15.0 g/dL   HCT 34.9 (L) 36.0 - 46.0 %   MCV 102.6 (H) 78.0 - 100.0 fL   MCH 32.1 26.0 - 34.0 pg   MCHC 31.2 30.0 - 36.0 g/dL   RDW 14.3 11.5 - 15.5 %   Platelets 180 150 - 400 K/uL    Comment: Performed at [facility], [address], [place], [place] [address]  Lactic acid, plasma     Status: None   Collection Time: 02/07/18  5:45 AM  Result Value Ref Range   Lactic Acid, Venous 1.6 0.5 - 1.9 mmol/L    Comment: Performed at [facility], [address], [place], [place] [address]    Dg Pelvis 1-2 Views  Result Date: 02/06/2018 EXAM: PELVIS - 1-2 VIEW COMPARISON:  CT, 04/08/2016 FINDINGS: No fracture.  No bone lesion.  Bones are demineralized. Hip joints,  SI joints and symphysis pubis are normally spaced and aligned. No arthropathic changes. There are no areas of bone resorption to suggest osteomyelitis. IMPRESSION: No fracture, dislocation or evidence of osteomyelitis. Electronically Signed   By: Lajean Manes M.D.   On: 02/06/2018 13:56   Dg Sacrum/coccyx  Result Date: 02/06/2018 CLINICAL DATA:  Sacral wound/pt has dementia/unable to obtain any history from pt-pt is from a nursing home. EXAM: SACRUM AND COCCYX - 2+ VIEW COMPARISON:  CT, 04/08/2016 FINDINGS:  No fracture or bone lesion. No areas of bone resorption are noted to suggest osteomyelitis. Hip joints, SI joints and symphysis pubis are normally aligned. IMPRESSION: No fracture or evidence of osteomyelitis. Electronically Signed   By: Lajean Manes M.D.   On: 02/06/2018 13:55   Procedure: Hervey Ard excisional debridement of Sacral Decubitus Ulcer, 5X7cm debridement down to the bone Preprocedure diagnosis: Sacral Decubitus ulcer, unstageable Postprocedure diagnosis: Stage 4 Decubitus ulcer   Description: Rn helped roll patient. Necrotic tissue noted overlying the sacral area.  Sharp debridement with scissors, area measuring at least 5X7cm. Down to bone.  Dead tissue removed down to bleeding tissue. Platelets were in normal limits. Gauze packing placed. ABD dressing.   Assessment & Plan:  Alexandra [name] is a 84 y.o. female with stage 4 sacral wound. Bedside debridement performed without issues. Also with some dry eschar on right hip.  - Santyl BID with damp to dry gauze packing for sacrum. - Air soft bed - Would keep pressure dressing on right and left hip - Will see in a few days to see if needs further debridement at bedside   Virl Cagey 02/07/2018, 11:52 AM

## 2018-02-07 NOTE — Progress Notes (Signed)
Patient placed on low air loss mattress per MD order. Warming blanket replaced. Currently patient's rectal temp 98.6 per rectal probe. Bed in lowest position with bed alarm on. Pt appears comfortable and in no acute distress.

## 2018-02-08 DIAGNOSIS — R0600 Dyspnea, unspecified: Secondary | ICD-10-CM

## 2018-02-08 LAB — BASIC METABOLIC PANEL
ANION GAP: 7 (ref 5–15)
BUN: 48 mg/dL — ABNORMAL HIGH (ref 8–23)
CALCIUM: 9.2 mg/dL (ref 8.9–10.3)
CO2: 24 mmol/L (ref 22–32)
Chloride: 129 mmol/L — ABNORMAL HIGH (ref 98–111)
Creatinine, Ser: 0.84 mg/dL (ref 0.44–1.00)
GFR calc non Af Amer: 59 mL/min — ABNORMAL LOW (ref >60)
GLUCOSE: 128 mg/dL — AB (ref 70–99)
Potassium: 4.1 mmol/L (ref 3.5–5.1)
Sodium: 160 mmol/L — ABNORMAL HIGH (ref 135–145)

## 2018-02-08 LAB — CBC
HEMATOCRIT: 32.2 % — AB (ref 36.0–46.0)
HEMOGLOBIN: 10.3 g/dL — AB (ref 12.0–15.0)
MCH: 32.6 pg (ref 26.0–34.0)
MCHC: 32 g/dL (ref 30.0–36.0)
MCV: 101.9 fL — AB (ref 78.0–100.0)
Platelets: 163 10*3/uL (ref 150–400)
RBC: 3.16 MIL/uL — AB (ref 3.87–5.11)
RDW: 14.2 % (ref 11.5–15.5)
WBC: 9.7 10*3/uL (ref 4.0–10.5)

## 2018-02-08 LAB — SEDIMENTATION RATE: Sed Rate: 80 mm/hr — ABNORMAL HIGH (ref 0–22)

## 2018-02-08 LAB — URINE CULTURE

## 2018-02-08 MED ORDER — IPRATROPIUM-ALBUTEROL 0.5-2.5 (3) MG/3ML IN SOLN
3.0000 mL | Freq: Four times a day (QID) | RESPIRATORY_TRACT | Status: DC
  Administered 2018-02-09 (×2): 3 mL via RESPIRATORY_TRACT
  Filled 2018-02-08 (×2): qty 3

## 2018-02-08 NOTE — Progress Notes (Signed)
Sacral ulcer debrided yesterday by general surgery.  Patient continues on triple antibiotics vancomycin Flagyl and Rocephin.  She has advanced dementia we will check a dementia profile and thyroid functions once again sodium still 161 despite switching to D5 half-normal at 75 cc an hour we will increase rate of flow to 100 cc/h get be met and CBC with differential in a.m. no evidence of septicemia lactic acid within normal limits. SOILA PRINTUP HUD:[id number] DOB: August 25, 1925 DOA: 02/06/2018 PCP: Lucia Gaskins, MD   Physical Exam: Blood pressure (!) 91/48, pulse 79, temperature 97.9 F (36.6 C), temperature source Rectal, resp. rate 20, height 5' (1.524 m), weight 37.3 kg (82 lb 3.7 oz), SpO2 97 %.  Lungs clear to A&P no rales wheeze rhonchi heart regular rhythm no murmurs gallops heaves thrills or rubs abdomen soft nontender bowel sounds normoactive   Investigations:  Recent Results (from the past 240 hour(s))  Urine culture     Status: Abnormal   Collection Time: 02/06/18  3:00 PM  Result Value Ref Range Status   Specimen Description   Final    URINE, CLEAN CATCH Performed at [facility], [address], [place], [place] [address]    Special Requests   Final    NONE Performed at [facility], [address], [place], [place] [address]    Culture >=100,000 COLONIES/mL ESCHERICHIA COLI (A)  Final   Report Status 02/08/2018 FINAL  Final   Organism ID, Bacteria ESCHERICHIA COLI (A)  Final      Susceptibility   Escherichia coli - MIC*    AMPICILLIN <=2 SENSITIVE Sensitive     CEFAZOLIN <=4 SENSITIVE Sensitive     CEFTRIAXONE <=1 SENSITIVE Sensitive     CIPROFLOXACIN <=0.25 SENSITIVE Sensitive     GENTAMICIN <=1 SENSITIVE Sensitive     IMIPENEM <=0.25 SENSITIVE Sensitive     NITROFURANTOIN 32 SENSITIVE Sensitive     TRIMETH/SULFA <=20 SENSITIVE Sensitive     AMPICILLIN/SULBACTAM <=2 SENSITIVE Sensitive     PIP/TAZO <=4 SENSITIVE Sensitive     Extended ESBL NEGATIVE Sensitive     *  >=100,000 COLONIES/mL ESCHERICHIA COLI  MRSA PCR Screening     Status: None   Collection Time: 02/06/18 11:10 PM  Result Value Ref Range Status   MRSA by PCR NEGATIVE NEGATIVE Final    Comment:        The GeneXpert MRSA Assay (FDA approved for NASAL specimens only), is one component of a comprehensive MRSA colonization surveillance program. It is not intended to diagnose MRSA infection nor to guide or monitor treatment for MRSA infections. Performed at [facility], [address], [place], [place] [address]      Basic Metabolic Panel: Recent Labs    02/07/18 0544 02/08/18 0538  NA 161* 160*  K 4.5 4.1  CL 130* 129*  CO2 24 24  GLUCOSE 100* 128*  BUN 52* 48*  CREATININE 0.83 0.84  CALCIUM 9.4 9.2   Liver Function Tests: Recent Labs    02/06/18 1226  AST 44*  ALT 35  ALKPHOS 87  BILITOT 0.8  PROT 6.9  ALBUMIN 2.3*     CBC: Recent Labs    02/06/18 1226 02/07/18 0544  WBC 15.6* 12.1*  NEUTROABS 13.5*  --   HGB 12.6 10.9*  HCT 39.9 34.9*  MCV 103.6* 102.6*  PLT 185 180    Dg Pelvis 1-2 Views  Result Date: 02/06/2018 EXAM: PELVIS - 1-2 VIEW COMPARISON:  CT, 04/08/2016 FINDINGS: No fracture.  No bone lesion.  Bones are demineralized. Hip joints, SI joints and symphysis pubis are normally spaced and aligned. No arthropathic changes. There are no areas of bone resorption to suggest osteomyelitis. IMPRESSION: No fracture, dislocation or evidence of osteomyelitis. Electronically Signed   By: Lajean Manes M.D.   On: 02/06/2018 13:56   Dg Sacrum/coccyx  Result Date: 02/06/2018 CLINICAL DATA:  Sacral wound/pt has dementia/unable to obtain any history from pt-pt is from a nursing home. EXAM: SACRUM AND COCCYX - 2+ VIEW COMPARISON:  CT, 04/08/2016 FINDINGS: No fracture or bone lesion. No areas of bone resorption are noted to suggest osteomyelitis. Hip joints, SI joints and symphysis pubis are normally aligned. IMPRESSION: No fracture or evidence of osteomyelitis.  Electronically Signed   By: Lajean Manes M.D.   On: 02/06/2018 13:55      Medications:   Impression:  Principal Problem:   Decubitus ulcer of sacral region, stage 4 (HCC) Active Problems:   Dementia   Dehydration   Hypernatremia   Severe protein-calorie malnutrition (HCC)   Hypoalbuminemia   Lactic acid increased   Leukocytosis   Wound infection     Plan: Altered rate of flow D5 half-normal saline from 75 to 100 cc/h continue triple antibiotics.  Be met and CBC in a.m. dementia protocol ordered today  Consultants: General surgery   Procedures sacral ulcer debridement yesterday   Antibiotics: Vancomycin Rocephin and Flagyl  Time spent: 30 minutes   LOS: 2 days   Jasn Xia Jerilynn Mages   02/08/2018,  Meilyn A Boster [id number] DOB: 1925/07/18 DOA: 02/06/2018 PCP: Lucia Gaskins, MD   Physical Exam:    Investigations:    Basic Metabolic Panel:  Liver Function Tests:   CBC:    LOS: 2 days   Chayton Murata M   02/08/2018, 11:15 AM

## 2018-02-08 NOTE — Progress Notes (Signed)
Called by RN that patient was hypotensive with blood pressure 85/69, O2 sats 80% on 2 L of oxygen by nasal cannula. Patient seen and examined, she has advanced dementia, aphasia. She is alert, and responsive. On exam-chest-bilateral wheezing auscultated  Patient is getting IV fluids D5 half-normal saline at 100 mL/h for hypernatremia She is likely developing fluid overload, she does have history of diastolic dysfunction with grade 1 seen on echo from 2016  Plan Hold IV fluids Stat chest x-ray portable Continuous pulse oximetry DuoNeb's every 6 hours Will monitor closely Patient is DNR  Critical care time spent-35 minutes

## 2018-02-09 ENCOUNTER — Inpatient Hospital Stay (HOSPITAL_COMMUNITY): Payer: Medicare Other

## 2018-02-09 ENCOUNTER — Encounter (HOSPITAL_COMMUNITY): Payer: Self-pay | Admitting: Primary Care

## 2018-02-09 DIAGNOSIS — Z7189 Other specified counseling: Secondary | ICD-10-CM

## 2018-02-09 DIAGNOSIS — Z515 Encounter for palliative care: Secondary | ICD-10-CM

## 2018-02-09 LAB — BASIC METABOLIC PANEL
Anion gap: 4 — ABNORMAL LOW (ref 5–15)
BUN: 39 mg/dL — AB (ref 8–23)
CHLORIDE: 123 mmol/L — AB (ref 98–111)
CO2: 26 mmol/L (ref 22–32)
CREATININE: 0.73 mg/dL (ref 0.44–1.00)
Calcium: 9.1 mg/dL (ref 8.9–10.3)
GFR calc Af Amer: >60 mL/min (ref >60)
GFR calc non Af Amer: >60 mL/min (ref >60)
GLUCOSE: 95 mg/dL (ref 70–99)
POTASSIUM: 3.1 mmol/L — AB (ref 3.5–5.1)
Sodium: 153 mmol/L — ABNORMAL HIGH (ref 135–145)

## 2018-02-09 LAB — CBC WITH DIFFERENTIAL/PLATELET
Basophils Absolute: 0 10*3/uL (ref 0.0–0.1)
Basophils Relative: 0 %
EOS ABS: 0.3 10*3/uL (ref 0.0–0.7)
EOS PCT: 3 %
HCT: 30.2 % — ABNORMAL LOW (ref 36.0–46.0)
Hemoglobin: 9.8 g/dL — ABNORMAL LOW (ref 12.0–15.0)
LYMPHS ABS: 1.2 10*3/uL (ref 0.7–4.0)
LYMPHS PCT: 13 %
MCH: 32.2 pg (ref 26.0–34.0)
MCHC: 32.5 g/dL (ref 30.0–36.0)
MCV: 99.3 fL (ref 78.0–100.0)
MONOS PCT: 4 %
Monocytes Absolute: 0.4 10*3/uL (ref 0.1–1.0)
Neutro Abs: 7.7 10*3/uL (ref 1.7–7.7)
Neutrophils Relative %: 80 %
PLATELETS: 171 10*3/uL (ref 150–400)
RBC: 3.04 MIL/uL — ABNORMAL LOW (ref 3.87–5.11)
RDW: 14.3 % (ref 11.5–15.5)
WBC: 9.6 10*3/uL (ref 4.0–10.5)

## 2018-02-09 LAB — VITAMIN B12: Vitamin B-12: 1526 pg/mL — ABNORMAL HIGH (ref 180–914)

## 2018-02-09 LAB — FOLATE: FOLATE: 49.5 ng/mL (ref >5.9)

## 2018-02-09 LAB — TSH: TSH: 1.979 u[IU]/mL (ref 0.350–4.500)

## 2018-02-09 LAB — RPR: RPR: NONREACTIVE

## 2018-02-09 MED ORDER — IPRATROPIUM-ALBUTEROL 0.5-2.5 (3) MG/3ML IN SOLN: 3.0000 mL | Freq: Four times a day (QID) | RESPIRATORY_TRACT | Status: DC | PRN

## 2018-02-09 MED ORDER — PRO-STAT SUGAR FREE PO LIQD
30.0000 mL | Freq: Three times a day (TID) | ORAL | Status: DC
  Administered 2018-02-09 – 2018-02-13 (×13): 30 mL via ORAL
  Filled 2018-02-09 (×13): qty 30

## 2018-02-09 MED ORDER — POTASSIUM CHLORIDE 10 MEQ/100ML IV SOLN
10.0000 meq | INTRAVENOUS | Status: AC
  Administered 2018-02-09 (×4): 10 meq via INTRAVENOUS
  Filled 2018-02-09 (×4): qty 100

## 2018-02-09 NOTE — Progress Notes (Signed)
Pt is refusing to wear nebulizer mask and is unable to hold nebulizer due to mittens in place. RT held nebulizer for pt. Pt became upset at the end of the treatment and was thrashing her head in the bed. Treatment finished and pt settled down. Pt on 1L [place] with O2 sat of 100%

## 2018-02-09 NOTE — Consult Note (Signed)
[place] Nurse wound consult note Reason for Consult: Pressure injuries Wound type: Pressure Pressure Injury POA: Yes [place] Nurse consult placed simultaneously to Surgical consult and Dr. Constance Haw saw patient 02/07/18 for wound debridement, orders, and will follow for sacral and hip ulcerations. Orders were placed for a mattress replacement with low air loss feature following debridement. Currently collagenase ointment orders in placed for topical wound care. Nutrition is a significant factor in tissue repair and regeneration. As there are two wounds to the feet, I have placed an order for pressure redistribution heel boots.  [facility] nursing team will not follow, but will remain available to this patient, the nursing and medical teams.  Please re-consult if needed. Thanks, Maudie Flakes, MSN, RN, [place], Arther Abbott  Pager# [phone]

## 2018-02-09 NOTE — Progress Notes (Signed)
Appreciate surgical debridement at bedside sodium somewhat improved down to 153 with D5 half-normal saline. Alexandra [name] PRF:[id number] DOB: Jul 14, 1926 DOA: 02/06/2018 PCP: Lucia Gaskins, MD   Physical Exam: Blood pressure (!) 102/51, pulse 75, temperature 98.4 F (36.9 C), temperature source Oral, resp. rate 18, height 5' (1.524 [name]), weight 37.3 kg (82 lb 3.7 oz), SpO2 100 %.  Lungs clear to A&P no rales or rhonchi heart regular rhythm no murmurs gallops heaves thrills or rubs abdomen soft nontender bowel sounds normoactive   Investigations:  Recent Results (from the past 240 hour(s))  Urine culture     Status: Abnormal   Collection Time: 02/06/18  3:00 PM  Result Value Ref Range Status   Specimen Description   Final    URINE, CLEAN CATCH Performed at [facility], [address] [place], [place] [address]    Special Requests   Final    NONE Performed at [facility] [address], [place], [place] [address]    Culture >=100,000 COLONIES/mL ESCHERICHIA COLI (A)  Final   Report Status 02/08/2018 FINAL  Final   Organism ID, Bacteria ESCHERICHIA COLI (A)  Final      Susceptibility   Escherichia coli - MIC*    AMPICILLIN <=2 SENSITIVE Sensitive     CEFAZOLIN <=4 SENSITIVE Sensitive     CEFTRIAXONE <=1 SENSITIVE Sensitive     CIPROFLOXACIN <=0.25 SENSITIVE Sensitive     GENTAMICIN <=1 SENSITIVE Sensitive     IMIPENEM <=0.25 SENSITIVE Sensitive     NITROFURANTOIN 32 SENSITIVE Sensitive     TRIMETH/SULFA <=20 SENSITIVE Sensitive     AMPICILLIN/SULBACTAM <=2 SENSITIVE Sensitive     PIP/TAZO <=4 SENSITIVE Sensitive     Extended ESBL NEGATIVE Sensitive     * >=100,000 COLONIES/mL ESCHERICHIA COLI  MRSA PCR Screening     Status: None   Collection Time: 02/06/18 11:10 PM  Result Value Ref Range Status   MRSA by PCR NEGATIVE NEGATIVE Final    Comment:        The GeneXpert MRSA Assay (FDA approved for NASAL specimens only), is one component of a comprehensive MRSA  colonization surveillance program. It is not intended to diagnose MRSA infection nor to guide or monitor treatment for MRSA infections. Performed at [facility], [address] [place], [place] [address]      Basic Metabolic Panel: Recent Labs    02/08/18 0538 02/09/18 0609  NA 160* 153*  K 4.1 3.1*  CL 129* 123*  CO2 24 26  GLUCOSE 128* 95  BUN 48* 39*  CREATININE 0.84 0.73  CALCIUM 9.2 9.1   Liver Function Tests: No results for input(s): AST, ALT, ALKPHOS, BILITOT, PROT, ALBUMIN in the last 72 hours.   CBC: Recent Labs    02/08/18 1219 02/09/18 0609  WBC 9.7 9.6  NEUTROABS  --  7.7  HGB 10.3* 9.8*  HCT 32.2* 30.2*  MCV 101.9* 99.3  PLT 163 171    Dg Chest Port 1v Same Day  Result Date: 02/09/2018 CLINICAL DATA:  Dyspnea. EXAM: PORTABLE CHEST 1 VIEW COMPARISON:  Radiographs 04/08/2016 FINDINGS: Unchanged heart size and mediastinal contours with aortic tortuosity and atherosclerosis. Borderline hyperinflation. No focal airspace disease. No pleural effusion, pulmonary edema or pneumothorax. No acute osseous abnormalities are seen. IMPRESSION: 1. Borderline hyperinflation without acute chest finding. 2.  Aortic Atherosclerosis (ICD10-I70.0). Electronically Signed   By: Jeb Levering [name].D.   On: 02/09/2018 02:34      Medications:   Impression:  Principal Problem:  Decubitus ulcer of sacral region, stage 4 (HCC) Active Problems:   Dementia   Dehydration   Hypernatremia   Severe protein-calorie malnutrition (HCC)   Hypoalbuminemia   Lactic acid increased   Leukocytosis   Wound infection     Plan: KCl 20 mEq liquid p.o. twice daily  Consultants: Surgery   Procedures   Antibiotics: Vancomycin Rocephin and Flagyl           Time spent: 30 minutes  LOS: 3 days   Alexandra [name]   02/09/2018, 1:00 PM

## 2018-02-09 NOTE — Progress Notes (Signed)
Patients Blood pressure 85/69. O2 sats 88% on 2L nasal cannula. Dr. Darrick Meigs came to floor to see patient. New orders placed and followed. Will continue to monitor.

## 2018-02-09 NOTE — Care Management Important Message (Signed)
Important Message  Patient Details  Name: MIKIA DELALUZ MRN: [id number] Date of Birth: 1925-08-02   Medicare Important Message Given:  Yes    Shelda Altes 02/09/2018, 11:53 AM

## 2018-02-09 NOTE — Consult Note (Signed)
Consultation Note Date: 02/09/2018   Patient Name: Alexandra [name]  DOB: May 28, 1926  MRN: [id number]  Age / Sex: 84 y.o., female  PCP: Lucia Gaskins, MD Referring Physician: Lucia Gaskins, MD  Reason for Consultation: Establishing goals of care  HPI/Patient Profile: 84 y.o. female  with past medical history of breast cancer, dementia, chronic neck and back pain, irritable bowel, high cholesterol, stage IV sacral wound admitted on 02/06/2018 with stage IV sacral wound.   Clinical Assessment and Goals of Care: [name] is lying quietly in bed.  Nursing staff is at bedside.  They are dressing her sacral wound which is severe.  She is nonverbal, unable to tell me her basic needs.  No family at bedside at this time.  Call to son, Aline Brochure, at number listed in chart.  [phone].  Fast busy.  No other number available.  Call to Napoleon Form, caregiver at group home.  His wife Kennyth Lose answers stating that she is the Scientist, physiological and that I can discuss [name] care with her.  I shared that I need to contact son Iona Beard, but she again states that [name] does not have a guardian.  I share that her son, Darcus Austin would be her responsible party.  She gives the phone to her husband, Napoleon Form.  Thedore Mins gives a good number for [employer].  He states that he called a few days ago and has not heard back from [place], it has been a couple of months since he has been able to speak to [place].  Call to son, Louvenia Golomb at [phone].  Left voicemail message.  Healthcare power of attorney NEXT OF KIN -Mrs. Markes's legal next of kin is her son Darcus Austin.  He would be her surrogate decision-maker by law.  Not the [facility] family.   SUMMARY OF RECOMMENDATIONS   At this point, continue to treat the treatable, but no CPR, no intubation. Hospice discussions in order.  Code Status/Advance Care Planning:  DNR  Symptom  Management:   Per PCP, no additional needs at this time.  Palliative Prophylaxis:   Frequent Pain Assessment, Palliative Wound Care and Turn Reposition  Additional Recommendations (Limitations, Scope, Preferences):  Treat the treatable but no CPR, no intubation.  Psycho-social/Spiritual:   Desire for further Chaplaincy support:no  Additional Recommendations: Caregiving  Support/Resources and Education on Hospice  Prognosis:   < 6 months or less would not be surprising based on severity of sacral wound, frailty, poor functional status, low albumin.  Discharge Planning: To be determined.      Primary Diagnoses: Present on Admission: . Dementia . Dehydration . Hypernatremia . Severe protein-calorie malnutrition ([place]) . Hypoalbuminemia . Lactic acid increased . Leukocytosis . Decubitus ulcer of sacral region, stage 4 ([place])   I have reviewed the medical record, interviewed the patient and family, and examined the patient. The following aspects are pertinent.  Past Medical History:  Diagnosis Date  . Breast cancer ([place])   . Cataract   . Chronic neck and back pain   .  Dementia   . Dementia   . Hyperlipidemia   . IBS (irritable bowel syndrome)    Social History   Socioeconomic History  . Marital status: Widowed    Spouse name: Not on file  . Number of children: Not on file  . Years of education: Not on file  . Highest education level: Not on file  Occupational History  . Not on file  Social Needs  . Financial resource strain: Not on file  . Food insecurity:    Worry: Not on file    Inability: Not on file  . Transportation needs:    Medical: Not on file    Non-medical: Not on file  Tobacco Use  . Smoking status: Former Research scientist (life sciences)  . Smokeless tobacco: Never Used  Substance and Sexual Activity  . Alcohol use: No    Alcohol/week: 0.0 oz    Comment: in the past per patient  . Drug use: No  . Sexual activity: Not on file  Lifestyle  . Physical activity:     Days per week: Not on file    Minutes per session: Not on file  . Stress: Not on file  Relationships  . Social connections:    Talks on phone: Not on file    Gets together: Not on file    Attends religious service: Not on file    Active member of club or organization: Not on file    Attends meetings of clubs or organizations: Not on file    Relationship status: Not on file  Other Topics Concern  . Not on file  Social History Narrative  . Not on file   Family History  Problem Relation Age of Onset  . Colon cancer Unknown        unknown   Scheduled Meds: . collagenase   Topical BID  . docusate sodium  100 mg Oral BID  . donepezil  10 mg Oral QHS  . feeding supplement (ENSURE ENLIVE)  237 mL Oral BID BM  . feeding supplement (PRO-STAT SUGAR FREE 64)  30 mL Oral TID  . heparin  5,000 Units Subcutaneous Q8H  . multivitamin  15 mL Oral Daily  . nutrition supplement (JUVEN)  1 packet Oral BID BM  . pantoprazole  40 mg Oral Daily  . senna  1 tablet Oral BID   Continuous Infusions: . cefTRIAXone (ROCEPHIN)  IV Stopped (02/08/18 1744)  . metronidazole Stopped (02/09/18 1100)  . potassium chloride    . vancomycin Stopped (02/08/18 1512)   PRN Meds:.acetaminophen, ipratropium-albuterol, magnesium hydroxide, ondansetron **OR** ondansetron (ZOFRAN) IV Medications Prior to Admission:  Prior to Admission medications   Medication Sig Start Date End Date Taking? Authorizing Provider  acetaminophen (TYLENOL) 650 MG CR tablet Take 650 mg by mouth every 4 (four) hours as needed for pain or fever.   Yes [provider]  alum & mag hydroxide-simeth (MAALOX/MYLANTA) 200-200-20 MG/5ML suspension Take 30 mLs by mouth every 6 (six) hours as needed for indigestion or heartburn.   Yes [provider]  anti-nausea (EMETROL) solution Take 10 mLs by mouth every 15 (fifteen) minutes as needed for nausea or vomiting.   Yes [provider]  dextromethorphan-guaiFENesin (MUCINEX  DM) 30-600 MG 12hr tablet Take 1 tablet by mouth at bedtime.   Yes [provider]  donepezil (ARICEPT) 10 MG tablet Take 1 tablet (10 mg total) by mouth at bedtime. 11/16/14  Yes Lucia Gaskins, MD  Multiple Vitamin (MULTIVITAMIN WITH MINERALS) TABS tablet Take 1 tablet  by mouth daily.   Yes [provider]  omeprazole (PRILOSEC) 20 MG capsule Take 20 mg by mouth daily. 10/20/14  Yes [provider]  potassium chloride SA (K-DUR,KLOR-CON) 20 MEQ tablet Take 1 tablet (20 mEq total) by mouth daily. 04/12/16  Yes Lucia Gaskins, MD  Dextromethorphan-Guaifenesin (TUSSIN DM) 10-100 MG/5ML liquid Take 15 mLs by mouth every 4 (four) hours as needed (for simple cough). Do not exceed 4 doses in 24 hours.    [provider]  hydroxypropyl methylcellulose / hypromellose (ISOPTO TEARS / GONIOVISC) 2.5 % ophthalmic solution 2 drops as needed (for eye lid irritation).    [provider]  liver oil-zinc oxide (DESITIN) 40 % ointment Apply 1 application topically as needed (for urine burn).    [provider]  magnesium hydroxide (MILK OF MAGNESIA) 400 MG/5ML suspension Take 30 mLs by mouth daily as needed for mild constipation.    [provider]  neomycin-bacitracin-polymyxin (NEOSPORIN) ointment Apply 1 application topically as needed for wound care. Wash affected area with soap and warm water first; then dry. Apply a small amount as needed for cuts or abrasions.    [provider]  phenol (CHLORASEPTIC) 1.4 % LIQD Use as directed 1 spray in the mouth or throat as needed for throat irritation / pain.    [provider]   Allergies  Allergen Reactions  . Aspirin     REACTION: "Messes up my heart and tells it to stop"  . Chocolate   . Penicillins     Has patient had a PCN reaction causing immediate rash, facial/tongue/throat swelling, SOB or lightheadedness with hypotension: unknown Has patient had a PCN reaction causing severe  rash involving mucus membranes or skin necrosis: unknown Has patient had a PCN reaction that required hospitalization unknown Has patient had a PCN reaction occurring within the last 10 years: unknown If all of the above answers are "NO", then may proceed with Cephalosporin use. REACTION: Pains and feels it messes her up   Review of Systems  Unable to perform ROS: Dementia    Physical Exam  Constitutional: No distress.  Appears frail, cachectic, acutely/chronically ill.  Does not make eye contact  HENT:  Head: Atraumatic.  Cardiovascular: Normal rate.  Pulmonary/Chest: Effort normal. No respiratory distress.  Abdominal: Soft. She exhibits no distension. There is no guarding.  Musculoskeletal: She exhibits no edema.  Muscle wasting  Neurological: She is alert.  Known dementia  Skin: Skin is warm and dry.  Very large stage IV sacral decub.  Right hip with foam dressing clean dry and intact  Psychiatric:  Unable to follow commands  Nursing note and vitals reviewed.   Vital Signs: BP 108/70 (BP Location: Left Arm)   Pulse 80   Temp 98.4 F (36.9 C) (Oral)   Resp 18   Ht 5' (1.524 m)   Wt 37.3 kg (82 lb 3.7 oz)   SpO2 100%   BMI 16.06 kg/m  Pain Scale: PAINAD   Pain Score: 0-No pain   SpO2: SpO2: 100 % O2 Device:SpO2: 100 % O2 Flow Rate: .O2 Flow Rate (L/min): 1 L/min  IO: Intake/output summary:   Intake/Output Summary (Last 24 hours) at 02/09/2018 1348 Last data filed at 02/09/2018 0400 Gross per 24 hour  Intake 856.67 ml  Output -  Net 856.67 ml    LBM: Last BM Date: 02/08/18 Baseline Weight: Weight: 36.3 kg (80 lb) Most recent weight: Weight: 37.3 kg (82 lb 3.7 oz)     Palliative  Assessment/Data:   Flowsheet Rows     Most Recent Value  Intake Tab  Referral Department  Hospitalist  Unit at Time of Referral  Cardiac/Telemetry Unit  Palliative Care Primary Diagnosis  Other (Comment)  Date Notified  02/06/18  Palliative Care Type  Return patient  Palliative Care  Reason for referral  Clarify Goals of Care  Date of Admission  02/06/18  Date first seen by Palliative Care  02/09/18  # of days Palliative referral response time  3 Day(s)  # of days IP prior to Palliative referral  0  Clinical Assessment  Palliative Performance Scale Score  20%  Pain Max last 24 hours  Not able to report  Pain Min Last 24 hours  Not able to report  Dyspnea Max Last 24 Hours  Not able to report  Dyspnea Min Last 24 hours  Not able to report  Psychosocial & Spiritual Assessment  Palliative Care Outcomes  Patient/Family meeting held?  Yes  Palliative Care Outcomes  Clarified goals of care      Time In: 1005 Time Out: 1040 Time Total: 35 minutes Greater than 50%  of this time was spent counseling and coordinating care related to the above assessment and plan.  Signed by: Drue Novel, NP   Please contact Palliative Medicine Team phone at [phone] for questions and concerns.  For individual provider: See Shea Evans

## 2018-02-09 NOTE — Progress Notes (Signed)
[facility]  Stage IV sacral decubitus wound looking better. Less necrotic tissue and fibrinous tissue. Continue santyl Bid with damp to dry gauze as ordered. Can follow with her SNF/ PCP for wounds as outpatient. No additional surgical intervention needed at this time.      Curlene Labrum, MD [facility] [address] [place], [place] [address] [phone] (office)

## 2018-02-09 NOTE — Progress Notes (Signed)
Calorie Count Note  48 hour calorie count ordered.  Diet: Soft with thin liquids Supplements: Juven and Ensure Enlive BID  Breakfast: 15% (this morning ~215 kcal, 7 gr protein), yesterday- 75 ml oj-40 kcal =505 kcal Lunch:10% of potatoes and green beans (50 kcal) no protein + 120 ml tea (50 kcal) =100 kcal Dinner: 0%                                                                                                                      =0 kcal Supplements: 475 kcal JUVEN and 245 kcal, 15 gr protein-Ensure Enlive                   =720 kcal  Total 48 hr intake:   1325 kcal or 662 kcal per day ( 44% of minimum estimated needs)      22 gr protein or 11 gr per day (15% of minimum estimated needs)  Estimated Nutritional Needs:  Kcal:  >1500 kcals (40g/kg bw) Protein:  >75g pro (2g/kg bw) Fluid:  >1.5 L (1 ml/kcal)  Nutrition Dx:  1. Severe malnutrition in chronic illness (dementia) severe muscle and fat loss and bilateral lower extremity edema.  2.Increased nutrient needs related to wound healing as evidenced by estimated nutritional requirements for this condition  Goal: Pt to meet >/= 90% of her estimated nutrition needs if feasible given her cognitive impairment.   Interventions:  -1 packet Juven BID, each packet provides 80 calories, 8 grams of carbohydrate, and 14 grams of amino acids; supplement contains CaHMB, glutamine, and arginine, to promote wound healing   -Ensure Enlive po TID, each supplement provides 350 kcal and 20 grams of protein -add ice cream to each since patient is receptive to chocolate flavor.  Colman Cater MS,RD,CSG,LDN Office: [phone] Pager: [phone]

## 2018-02-09 NOTE — Progress Notes (Signed)
Dressing changed this morning to sacral ulcer.  Santyl and wet to dry applied with foam covering. Has not eaten much but can drink well from straw.

## 2018-02-10 LAB — BASIC METABOLIC PANEL
ANION GAP: 8 (ref 5–15)
BUN: 42 mg/dL — AB (ref 8–23)
CHLORIDE: 125 mmol/L — AB (ref 98–111)
CO2: 21 mmol/L — ABNORMAL LOW (ref 22–32)
Calcium: 9.4 mg/dL (ref 8.9–10.3)
Creatinine, Ser: 0.67 mg/dL (ref 0.44–1.00)
GFR calc Af Amer: >60 mL/min (ref >60)
Glucose, Bld: 95 mg/dL (ref 70–99)
POTASSIUM: 3.9 mmol/L (ref 3.5–5.1)
SODIUM: 154 mmol/L — AB (ref 135–145)

## 2018-02-10 LAB — VANCOMYCIN, TROUGH: VANCOMYCIN TR: 7 ug/mL — AB (ref 15–20)

## 2018-02-10 MED ORDER — POTASSIUM CHLORIDE 10 MEQ/100ML IV SOLN
10.0000 meq | INTRAVENOUS | Status: AC
  Administered 2018-02-10 (×4): 10 meq via INTRAVENOUS
  Filled 2018-02-10 (×4): qty 100

## 2018-02-10 NOTE — Progress Notes (Signed)
Will obtain palliative care consult regarding abysmal prognosis of patient with her sacral ulcers and advanced dementia she would require at least skilled nursing facility if not hospice after this hospitalization [name]:[id number] DOB: 08-30-1925 DOA: 02/06/2018 PCP: Lucia Gaskins, MD   Physical Exam: Blood pressure (!) 110/58, pulse 78, temperature 98.2 F (36.8 C), temperature source Oral, resp. rate 17, height 5' (1.524 m), weight 37.3 kg (82 lb 3.7 oz), SpO2 100 %.  Lungs clear to A&P no rales wheeze or rhonchi heart regular rhythm no murmurs gallops heaves thrills or rubs abdomen soft nontender bowel sounds normoactive   Investigations:  Recent Results (from the past 240 hour(s))  Urine culture     Status: Abnormal   Collection Time: 02/06/18  3:00 PM  Result Value Ref Range Status   Specimen Description   Final    URINE, CLEAN CATCH Performed at [facility], [address] [place], [place] [address]    Special Requests   Final    NONE Performed at [facility], [address], [place], [place] [address]    Culture >=100,000 COLONIES/mL ESCHERICHIA COLI (A)  Final   Report Status 02/08/2018 FINAL  Final   Organism ID, Bacteria ESCHERICHIA COLI (A)  Final      Susceptibility   Escherichia coli - MIC*    AMPICILLIN <=2 SENSITIVE Sensitive     CEFAZOLIN <=4 SENSITIVE Sensitive     CEFTRIAXONE <=1 SENSITIVE Sensitive     CIPROFLOXACIN <=0.25 SENSITIVE Sensitive     GENTAMICIN <=1 SENSITIVE Sensitive     IMIPENEM <=0.25 SENSITIVE Sensitive     NITROFURANTOIN 32 SENSITIVE Sensitive     TRIMETH/SULFA <=20 SENSITIVE Sensitive     AMPICILLIN/SULBACTAM <=2 SENSITIVE Sensitive     PIP/TAZO <=4 SENSITIVE Sensitive     Extended ESBL NEGATIVE Sensitive     * >=100,000 COLONIES/mL ESCHERICHIA COLI  MRSA PCR Screening     Status: None   Collection Time: 02/06/18 11:10 PM  Result Value Ref Range Status   MRSA by PCR NEGATIVE NEGATIVE Final    Comment:        The GeneXpert  MRSA Assay (FDA approved for NASAL specimens only), is one component of a comprehensive MRSA colonization surveillance program. It is not intended to diagnose MRSA infection nor to guide or monitor treatment for MRSA infections. Performed at [facility], [address] [place], [place] [address]      Basic Metabolic Panel: Recent Labs    02/09/18 [phone] 02/10/18 0613  NA 153* 154*  K 3.1* 3.9  CL 123* 125*  CO2 26 21*  GLUCOSE 95 95  BUN 39* 42*  CREATININE 0.73 0.67  CALCIUM 9.1 9.4   Liver Function Tests: No results for input(s): AST, ALT, ALKPHOS, BILITOT, PROT, ALBUMIN in the last 72 hours.   CBC: Recent Labs    02/08/18 1219 02/09/18 0609  WBC 9.7 9.6  NEUTROABS  --  7.7  HGB 10.3* 9.8*  HCT 32.2* 30.2*  MCV 101.9* 99.3  PLT 163 171    Dg Chest Port 1v Same Day  Result Date: 02/09/2018 CLINICAL DATA:  Dyspnea. EXAM: PORTABLE CHEST 1 VIEW COMPARISON:  Radiographs 04/08/2016 FINDINGS: Unchanged heart size and mediastinal contours with aortic tortuosity and atherosclerosis. Borderline hyperinflation. No focal airspace disease. No pleural effusion, pulmonary edema or pneumothorax. No acute osseous abnormalities are seen. IMPRESSION: 1. Borderline hyperinflation without acute chest finding. 2.  Aortic Atherosclerosis (ICD10-I70.0). Electronically Signed   By: Fonnie Birkenhead.D.  On: 02/09/2018 02:34      Medications:   Impression:  Principal Problem:   Decubitus ulcer of sacral region, stage 4 (HCC) Active Problems:   Dementia   Dehydration   Hypernatremia   Severe protein-calorie malnutrition (HCC)   Hypoalbuminemia   Lactic acid increased   Leukocytosis   Wound infection   Palliative care by specialist     Plan: Continue D5 half-normal saline at 100 cc/h.  Obtain palliative care consult and consideration of hospice care?  Consultants: Palliative care   Procedures   Antibiotics: Vancomycin Rocephin and Flagyl         Time spent:  30 minutes   LOS: 4 days   [name]   02/10/2018, 1:50 PM

## 2018-02-10 NOTE — Clinical Social Work Note (Signed)
Referral made to APS to request assistance reaching pt's son for decision making regarding pt's discharge planning. Per MD, pt will need SNF at dc. Will work on [employer] for now.

## 2018-02-10 NOTE — Progress Notes (Signed)
Palliative: Alexandra [name] is lying in bed, her eyes are open and she will briefly look at me, but not keep eye contact.  She is basically nonverbal, unable to make her basic needs known.  I asked if she would like something to drink and she does not answer.  She will draw from the straw when I hold the cup near her mouth.  She seems to have some delayed swallowing.  There is no family at bedside at this time. Multiple calls to son, Alexandra [name].  Voicemail message left. Conference with social work related to plan of care/disposition.  She shares that an Adult [employer] referral has been made. Conference with PCP, Dr. Cindie Laroche.  We discussed current health condition, family dynamics, and disposition options.  Dr. Cindie Laroche and palliative medicine provider agree that Alexandra [name] would benefit from comfort and dignity at end of life, residential hospice. Prognosis of 4 weeks or less would be expected for full comfort care. Another call made to son, Alexandra [name].  It seems that the phone is turned off, goes right to his voicemail.  Voicemail message left requesting return phone call. 38 minutes Quinn Axe, NP Palliative Medicine Team Team Phone # [phone]  Greater than 50% of this time was spent counseling and coordinating care related to the above assessment and plan

## 2018-02-10 NOTE — Clinical Social Work Note (Signed)
Clinical Social Work Assessment  Patient Details  Name: RAELEE [name] MRN: [id number] Date of Birth: Oct 17, 1925  Date of referral:  02/10/18               Reason for consult:  Discharge Planning                Permission sought to share information with:    Permission granted to share information::     Name::        Agency::     Relationship::     Contact Information:     Housing/Transportation Living arrangements for the past 2 months:  Group Home Source of Information:  Other (Comment Required)(chart) Patient Interpreter Needed:  None Criminal Activity/Legal Involvement Pertinent to Current Situation/Hospitalization:  No - Comment as needed Significant Relationships:  None Lives with:  Facility Resident Do you feel safe going back to the place where you live?  (unable to assess) Need for family participation in patient care:  Yes (Comment)  Care giving concerns: Pt resides in a [facility]. She has a stage IV pressure ulcer on her sacrum. Wound was covered in feces at the time of ED presentation per notes.    Social Worker assessment / plan: Pt is a 84 year old female admitted from [facility]. Reviewed pt's record today. Pt has dementia and is non-responsive. Palliative Care notes indicate new phone number to contact pt's son. Attempted to reach him and only got voicemail. HIPPA compliant message left requesting return call. Unable to reach staff at the [facility]. HIPPA compliant message left at that number as well. Message sent to MD to inquire if pt needs a higher level of care than what she currently has. Will follow and continue to assess and assist with pt's dc planning and placement needs.  Employment status:  Retired Nurse, adult PT Recommendations:  Not assessed at this time Information / Referral to community resources:     Patient/Family's Response to care: RN notes indicate pt is uncooperative with care. She has severe  dementia.  Patient/Family's Understanding of and Emotional Response to Diagnosis, Current Treatment, and Prognosis: Pt has severe dementia and is not able to understand diagnosis or treatment recommendations. Unable to reach family or [facility] staff this AM. Unable to assess pt's emotional well being.  Emotional Assessment Appearance:  Appears stated age Attitude/Demeanor/Rapport:  Unresponsive Affect (typically observed):  Unable to Assess Orientation:    Alcohol / Substance use:  Not Applicable Psych involvement (Current and /or in the community):  No (Comment)  Discharge Needs  Concerns to be addressed:  Discharge Planning Concerns Readmission within the last 30 days:  No Current discharge risk:  Cognitively Impaired, Chronically ill Barriers to Discharge:  Continued Medical Work up   [employer], LCSW 02/10/2018, 11:53 AM

## 2018-02-11 DIAGNOSIS — Z7189 Other specified counseling: Secondary | ICD-10-CM

## 2018-02-11 MED ORDER — SODIUM CHLORIDE 0.9 % IV SOLN
1.0000 g | INTRAVENOUS | Status: DC
  Administered 2018-02-11 – 2018-02-12 (×2): 1 g via INTRAVENOUS
  Filled 2018-02-11: qty 1
  Filled 2018-02-11: qty 10
  Filled 2018-02-11: qty 1
  Filled 2018-02-11 (×2): qty 10

## 2018-02-11 MED ORDER — VANCOMYCIN HCL 500 MG IV SOLR
500.0000 mg | INTRAVENOUS | Status: DC
  Administered 2018-02-11: 500 mg via INTRAVENOUS
  Filled 2018-02-11 (×5): qty 500

## 2018-02-11 MED ORDER — METRONIDAZOLE IN NACL 5-0.79 MG/ML-% IV SOLN
500.0000 mg | Freq: Three times a day (TID) | INTRAVENOUS | Status: DC
Start: 2018-02-11 — End: 2018-02-13
  Administered 2018-02-11 – 2018-02-13 (×6): 500 mg via INTRAVENOUS
  Filled 2018-02-11 (×6): qty 100

## 2018-02-11 NOTE — Clinical Social Work Note (Signed)
Alexandra [name], [facility] APS, stated that she filed for interim guardianship with a hearing at 10:00 a.m. She stated that she would contact CSW in the morning. She is agreeable to residential hospice.   Referral to residential hospice to be made once guardianship is appointed.     Alexandra [name], Clydene Pugh, LCSW

## 2018-02-11 NOTE — Progress Notes (Signed)
Social services currently in room they have spoken to sister to inform her she is in hospital have not been able to contact her brother Alexandra [name] she states she will have the sheriff's department serve the patient with then social services can take control and is a possibility of placing her at hospice exist at that point if family cannot be contacted patient fairly comfortable currently on 3 antibiotics Alexandra [name] OXB:[id number] DOB: 09-19-1925 DOA: 02/06/2018 PCP: Alexandra Gaskins, MD   Physical Exam: Blood pressure 91/66, pulse 78, temperature (!) 97.5 F (36.4 C), temperature source Axillary, resp. rate 16, height 5' (1.524 [name]), weight 37.3 kg (82 lb 3.7 oz), SpO2 100 %.  Lungs clear to A&P no rales rhonchi heart regular rhythm no murmurs gallops heaves or rubs abdomen soft nontender bowel sounds normoactive   Investigations:  Recent Results (from the past 240 hour(s))  Urine culture     Status: Abnormal   Collection Time: 02/06/18  3:00 PM  Result Value Ref Range Status   Specimen Description   Final    URINE, CLEAN CATCH Performed at [facility], [address] [place], [place] [address]    Special Requests   Final    NONE Performed at [facility], [address] [place], [place] [address]    Culture >=100,000 COLONIES/mL ESCHERICHIA COLI (A)  Final   Report Status 02/08/2018 FINAL  Final   Organism ID, Bacteria ESCHERICHIA COLI (A)  Final      Susceptibility   Escherichia coli - MIC*    AMPICILLIN <=2 SENSITIVE Sensitive     CEFAZOLIN <=4 SENSITIVE Sensitive     CEFTRIAXONE <=1 SENSITIVE Sensitive     CIPROFLOXACIN <=0.25 SENSITIVE Sensitive     GENTAMICIN <=1 SENSITIVE Sensitive     IMIPENEM <=0.25 SENSITIVE Sensitive     NITROFURANTOIN 32 SENSITIVE Sensitive     TRIMETH/SULFA <=20 SENSITIVE Sensitive     AMPICILLIN/SULBACTAM <=2 SENSITIVE Sensitive     PIP/TAZO <=4 SENSITIVE Sensitive     Extended ESBL NEGATIVE Sensitive     * >=100,000 COLONIES/mL ESCHERICHIA COLI   MRSA PCR Screening     Status: None   Collection Time: 02/06/18 11:10 PM  Result Value Ref Range Status   MRSA by PCR NEGATIVE NEGATIVE Final    Comment:        The GeneXpert MRSA Assay (FDA approved for NASAL specimens only), is one component of a comprehensive MRSA colonization surveillance program. It is not intended to diagnose MRSA infection nor to guide or monitor treatment for MRSA infections. Performed at [facility], [address], [place], [place] [address]      Basic Metabolic Panel: Recent Labs    02/09/18 [phone] 02/10/18 0613  NA 153* 154*  K 3.1* 3.9  CL 123* 125*  CO2 26 21*  GLUCOSE 95 95  BUN 39* 42*  CREATININE 0.73 0.67  CALCIUM 9.1 9.4   Liver Function Tests: No results for input(s): AST, ALT, ALKPHOS, BILITOT, PROT, ALBUMIN in the last 72 hours.   CBC: Recent Labs    02/09/18 0609  WBC 9.6  NEUTROABS 7.7  HGB 9.8*  HCT 30.2*  MCV 99.3  PLT 171    No results found.    Medications:   Impression:  Principal Problem:   Decubitus ulcer of sacral region, stage 4 (HCC) Active Problems:   Dementia   Dehydration   Hypernatremia   Severe protein-calorie malnutrition (HCC)   Hypoalbuminemia   Lactic acid increased   Leukocytosis  Wound infection   Palliative care by specialist     Plan: Awaiting results of social services and placement and hospice if deemed appropriate continue all 3 antibiotics with air soft mattress  Consultants: General surgery   Procedures   Antibiotics: Vancomycin Flagyl and Rocephin          Time spent: 30 minutes   LOS: 5 days   Alexandra [name]   02/11/2018, 12:33 PM

## 2018-02-11 NOTE — Progress Notes (Signed)
Palliative:  Alexandra [name] is resting quietly in bed.  She does not answer me and will only briefly make eye contact.  There is no family at bedside.  Call to son Alexandra [name], phone goes right to voice mail. Left VM message.   Conversation with Education officer, museum rt goal of residential hospice and APS.  Per PCP note, DSS social worker has been in contact with Alexandra [name] sister.  Alexandra [name] sister can legally make healthcare decisions if son is unavailable/unwilling.  Unfortunately, Alexandra [name] sister's contact information is not available.  Encourage contact to Alexandra [name]'s sister to discuss the benefits of residential hospice for comfort and dignity at end of life.  58 minutes  Alexandra Axe, NP Palliative Medicine Team Team Phone # [phone]  Greater than 50% of this time was spent counseling and coordinating care related to the above assessment and plan

## 2018-02-11 NOTE — Progress Notes (Signed)
Antibiotics discontinued yesterday 7/30 by nursing. Per Dr Cindie Laroche, they should not have been stopped and requested them to be restarted.

## 2018-02-11 NOTE — Progress Notes (Signed)
Pharmacy Antibiotic Note  Alexandra [name] is a 84 y.o. female admitted on 02/06/2018 with wound infection.  Pharmacy has been consulted for Vancomycin dosing.  Plan: Restart antibiotics- should not have been discontinued 7/30 per MD Vancomycin 500 mg IV every 24 hours Ceftriaxone 1gm IV q24h Flagyl 500mg  IV q8h F/U cxs and clinical progress Monitor V/s, labs and levels as indicated  Height: 5' (152.4 cm) Weight: 82 lb 3.7 oz (37.3 kg) IBW/kg (Calculated) : 45.5  Temp (24hrs), Avg:98 F (36.7 C), Min:97.5 F (36.4 C), Max:98.8 F (37.1 C)  Recent Labs  Lab 02/06/18 1226 02/06/18 1356 02/07/18 0544 02/07/18 0545 02/08/18 0538 02/08/18 1219 02/09/18 0609 02/10/18 0613 02/10/18 1443  WBC 15.6*  --  12.1*  --   --  9.7 9.6  --   --   CREATININE 1.04*  --  0.83  --  0.84  --  0.73 0.67  --   LATICACIDVEN 3.1* 3.9*  --  1.6  --   --   --   --   --   VANCOTROUGH  --   --   --   --   --   --   --   --  7*    Estimated Creatinine Clearance: 27.5 mL/min (by C-G formula based on SCr of 0.67 mg/dL).    Allergies  Allergen Reactions  . Aspirin     REACTION: "Messes up my heart and tells it to stop"  . Chocolate   . Penicillins     Has patient had a PCN reaction causing immediate rash, facial/tongue/throat swelling, SOB or lightheadedness with hypotension: unknown Has patient had a PCN reaction causing severe rash involving mucus membranes or skin necrosis: unknown Has patient had a PCN reaction that required hospitalization unknown Has patient had a PCN reaction occurring within the last 10 years: unknown If all of the above answers are "NO", then may proceed with Cephalosporin use. REACTION: Pains and feels it messes her up    Antimicrobials this admission: Vancomycin 7/26 >>  Ceftriaxone 7/26>>  Flagyl 7/26>>  Dose adjustments this admission: n/a  Microbiology results:  7/26 UCx: <100,000 e. coli 7/26 MRSA PCR: negative  Thank you for allowing pharmacy to be a part of  this patient's care.  Margot Ables, PharmD Clinical Pharmacist 02/11/2018 2:46 PM

## 2018-02-11 NOTE — Social Work (Signed)
Patient served with papers by Officer [place] with [facility] Department. Paperwork place on chart at [place]. Voicemail left to notify [employer] LCSW.

## 2018-02-11 NOTE — Care Management Important Message (Signed)
Important Message  Patient Details  Name: Alexandra [name] MRN: [id number] Date of Birth: [birth year]   Medicare Important Message Given:  Yes\ Pt. Unable to sign    Shelda Altes 02/11/2018, 12:54 PM

## 2018-02-12 MED ORDER — ORAL CARE MOUTH RINSE
15.0000 mL | Freq: Two times a day (BID) | OROMUCOSAL | Status: DC
  Administered 2018-02-12 – 2018-02-13 (×2): 15 mL via OROMUCOSAL

## 2018-02-12 NOTE — Progress Notes (Signed)
  Drank all of ensure and part of boost breeze.

## 2018-02-12 NOTE — Care Management Note (Signed)
Case Management Note  Patient Details  Name: [name] MRN: [id number] Date of Birth: 09-Jul-1926  If discussed at Long Length of Stay Meetings, dates discussed:  02/12/2018  Additional Comments:  Sherald Barge, RN 02/12/2018, 12:11 PM

## 2018-02-12 NOTE — Progress Notes (Signed)
Social work now has guardianship we will prepare to transfer patient to inpatient hospice tomorrow continuing 3 antibiotics at present patient response to tactile stimuli alone Alexandra [name] DTO:[id number] DOB: 1926/07/12 DOA: 02/06/2018 PCP: Alexandra Gaskins, MD   Physical Exam: Blood pressure (!) 91/50, pulse 76, temperature 98.4 F (36.9 C), temperature source Oral, resp. rate 16, height 5' (1.524 m), weight 37.3 kg (82 lb 3.7 oz), SpO2 100 %.  Lungs clear to A&P no rales wheeze or rhonchi heart regular rhythm no S3-S4 no heaves thrills or rub   Investigations:  Recent Results (from the past 240 hour(s))  Urine culture     Status: Abnormal   Collection Time: 02/06/18  3:00 PM  Result Value Ref Range Status   Specimen Description   Final    URINE, CLEAN CATCH Performed at [facility], [address], [place], [place] [address]    Special Requests   Final    NONE Performed at [facility], [address], [place], [place] [address]    Culture >=100,000 COLONIES/mL ESCHERICHIA COLI (A)  Final   Report Status 02/08/2018 FINAL  Final   Organism ID, Bacteria ESCHERICHIA COLI (A)  Final      Susceptibility   Escherichia coli - MIC*    AMPICILLIN <=2 SENSITIVE Sensitive     CEFAZOLIN <=4 SENSITIVE Sensitive     CEFTRIAXONE <=1 SENSITIVE Sensitive     CIPROFLOXACIN <=0.25 SENSITIVE Sensitive     GENTAMICIN <=1 SENSITIVE Sensitive     IMIPENEM <=0.25 SENSITIVE Sensitive     NITROFURANTOIN 32 SENSITIVE Sensitive     TRIMETH/SULFA <=20 SENSITIVE Sensitive     AMPICILLIN/SULBACTAM <=2 SENSITIVE Sensitive     PIP/TAZO <=4 SENSITIVE Sensitive     Extended ESBL NEGATIVE Sensitive     * >=100,000 COLONIES/mL ESCHERICHIA COLI  MRSA PCR Screening     Status: None   Collection Time: 02/06/18 11:10 PM  Result Value Ref Range Status   MRSA by PCR NEGATIVE NEGATIVE Final    Comment:        The GeneXpert MRSA Assay (FDA approved for NASAL specimens only), is one component of a comprehensive  MRSA colonization surveillance program. It is not intended to diagnose MRSA infection nor to guide or monitor treatment for MRSA infections. Performed at [facility], [address] [place], [place] [address]      Basic Metabolic Panel: Recent Labs    02/10/18 0613  NA 154*  K 3.9  CL 125*  CO2 21*  GLUCOSE 95  BUN 42*  CREATININE 0.67  CALCIUM 9.4   Liver Function Tests: No results for input(s): AST, ALT, ALKPHOS, BILITOT, PROT, ALBUMIN in the last 72 hours.   CBC: No results for input(s): WBC, NEUTROABS, HGB, HCT, MCV, PLT in the last 72 hours.  No results found.    Medications:  Impression:  Principal Problem:   Decubitus ulcer of sacral region, stage 4 (HCC) Active Problems:   Dementia   Dehydration   Hypernatremia   Severe protein-calorie malnutrition (HCC)   Hypoalbuminemia   Lactic acid increased   Leukocytosis   Wound infection   Palliative care by specialist   Encounter for hospice care discussion     Plan: Transfer to hospice care in a.m. Friday  Consultants: Palliative care general surgery   Procedures wound debridement per surgery   Antibiotics: Vancomycin Rocephin and Flagyl         Time spent: 30 minutes   LOS: 6 days   Alexandra [name] Alexandra [name]  02/12/2018, 2:00 PM

## 2018-02-12 NOTE — Clinical Social Work Note (Signed)
Received call from [place] with APS stating that she was granted temporary guardianship and she would like CSW to refer pt to Residential Hospice as recommended by Palliative APNP.   Referral sent. Will follow and continue to assist with pt's psychosocial/dc planning needs.

## 2018-02-12 NOTE — Clinical Social Work Note (Signed)
Alexandra [name], Alexandra [name] #[id number] (CSN: [id number]) (84 y.o. F) (Adm: 02/06/18)  [id number]  PCP   DONDIEGO, RICHARD  Demographics  Comment    Last edited by  on at   Address: Home Phone: Work Phone: Mobile Phone:  [place]  [place] [address]   [phone] -- --  SSN: Insurance: Marital Status: Religion:  [id number] [place] Widowed (none)  Patient Ethnicity & Race   Ethnic Group Patient Race  Not Hispanic or Latino Black or African American  Documents Filed to Patient   Power of Attorney Living Will Clinical Unknown Study Attachment Consent Form ABN Waiver After Visit Summary Lab Result Scan Code Status MyChart Status Advance Care Planning  Not on File Not on File Not on File Not on File Not on File Not on File Filed Not on File DNR [Updated on 02/06/18 1839] Code Exp Jump to the Activity  Admission Information   Attending Provider Admitting Provider Admission Type Admission Date/Time  Lucia Gaskins, MD Paticia Stack, MD Emergency 02/06/18 1053  Discharge Date Hospital Service Auth/Cert Status Service Area   Internal Medicine Incomplete [facility]  Unit Room/Bed Admission Status   AP-DEPT 300 A318/A318-01 Admission (Confirmed)   Hospital Account   Name Acct ID Class Status Primary Coverage  Alexandra [name], Alexandra [name] [id number] Inpatient [place]      Guarantor Account (for Hospital Account [id number])   Name Relation to Pt Service Area Active? Acct Type  Alexandra [name] Self [facility]SA Yes Personal/Family  Address Phone    [address] [place], [place] [address] [phone])        Coverage Information (for Hospital Account [id number])   F/O Payor/Plan Precert #  [facility] #  Danyel, Tobey [id number]  [place] [address] [phone]

## 2018-02-12 NOTE — Progress Notes (Signed)
Dressings changed to sacrum and heels today. Put warm blankets on when unable to obtain temp and was able to get temp in groin.  Bed available late this afternoon at [facility] and will be discharged tomorrow.

## 2018-02-13 MED ORDER — COLLAGENASE 250 UNIT/GM EX OINT: TOPICAL_OINTMENT | Freq: Two times a day (BID) | CUTANEOUS | 0 refills | Status: AC

## 2018-02-13 MED ORDER — ENSURE ENLIVE PO LIQD: 237.0000 mL | Freq: Two times a day (BID) | ORAL | 12 refills | Status: AC

## 2018-02-13 MED ORDER — DOCUSATE SODIUM 100 MG PO CAPS: 100.0000 mg | ORAL_CAPSULE | Freq: Two times a day (BID) | ORAL | 0 refills | Status: AC

## 2018-02-13 MED ORDER — ORAL CARE MOUTH RINSE: 15.0000 mL | Freq: Two times a day (BID) | OROMUCOSAL | 3 refills | Status: AC

## 2018-02-13 NOTE — Discharge Summary (Signed)
Physician Discharge Summary  Alexandra [name] EXN:[id number] DOB: 1925/11/17 DOA: 02/06/2018  PCP: Lucia Gaskins, MD  Admit date: 02/06/2018 Discharge date: 02/13/2018   Recommendations for Outpatient Follow-up:  Patient is discharged to hospice care for palliative care terminal treatment and comfort measures only. Discharge Diagnoses:  Principal Problem:   Decubitus ulcer of sacral region, stage 4 (HCC) Active Problems:   Dementia   Dehydration   Hypernatremia   Severe protein-calorie malnutrition (HCC)   Hypoalbuminemia   Lactic acid increased   Leukocytosis   Wound infection   Palliative care by specialist   Encounter for hospice care discussion   Discharge Condition: Abysmal  Filed Weights   02/06/18 1059 02/06/18 1828  Weight: 36.3 kg (80 lb) 37.3 kg (82 lb 3.7 oz)    History of present illness:  Patient is a 84 year old black female with advanced chronic dementia generalized sacral and hip decubiti which were debrided and cleansed in the hospital placed on triple antibiotics as well as ointments given an air soft mattress and an air bed patient responds only to tactile stimuli has no verbal communication we tried in pain to find her family she has a son Iona Beard social services was contacted they obtained  Consent for which allowed [place] to discharge her to hospice care which is more appropriate given her level of demise.  Although she had opioid analgesia in the hospital she did not take any so therefore none was administered in her discharge summary  Hospital Course:  See HPI above  Procedures:  Wound debridement by general surgery  Consultations:  General surgery and palliative care as well as social services  Discharge Instructions  Discharge Instructions    Discharge instructions   Complete by:  As directed    Discharge patient   Complete by:  As directed    Discharge disposition:  [place] Not Defined   Discharge patient date:   02/13/2018     Allergies as of 02/13/2018      Reactions   Aspirin    REACTION: "Messes up my heart and tells it to stop"   Chocolate    Penicillins    Has patient had a PCN reaction causing immediate rash, facial/tongue/throat swelling, SOB or lightheadedness with hypotension: unknown Has patient had a PCN reaction causing severe rash involving mucus membranes or skin necrosis: unknown Has patient had a PCN reaction that required hospitalization unknown Has patient had a PCN reaction occurring within the last 10 years: unknown If all of the above answers are "NO", then may proceed with Cephalosporin use. REACTION: Pains and feels it messes her up      Medication List    STOP taking these medications   acetaminophen 650 MG CR tablet Commonly known as:  TYLENOL   alum & mag hydroxide-simeth 496-759-16 MG/5ML suspension Commonly known as:  MAALOX/MYLANTA   anti-nausea solution   dextromethorphan-guaiFENesin 30-600 MG 12hr tablet Commonly known as:  MUCINEX DM   donepezil 10 MG tablet Commonly known as:  ARICEPT   omeprazole 20 MG capsule Commonly known as:  PRILOSEC   phenol 1.4 % Liqd Commonly known as:  CHLORASEPTIC   potassium chloride SA 20 MEQ tablet Commonly known as:  K-DUR,KLOR-CON   TUSSIN DM 10-100 MG/5ML liquid Generic drug:  Dextromethorphan-guaiFENesin     TAKE these medications   collagenase ointment Commonly known as:  SANTYL Apply topically 2 (two) times daily.   docusate sodium 100 MG capsule Commonly known as:  COLACE Take  1 capsule (100 mg total) by mouth 2 (two) times daily.   feeding supplement (ENSURE ENLIVE) Liqd Take 237 mLs by mouth 2 (two) times daily between meals.   hydroxypropyl methylcellulose / hypromellose 2.5 % ophthalmic solution Commonly known as:  ISOPTO TEARS / GONIOVISC 2 drops as needed (for eye lid irritation).   liver oil-zinc oxide 40 % ointment Commonly known as:  DESITIN Apply 1 application topically as needed (for  urine burn).   magnesium hydroxide 400 MG/5ML suspension Commonly known as:  MILK OF MAGNESIA Take 30 mLs by mouth daily as needed for mild constipation.   mouth rinse Liqd solution 15 mLs by Mouth Rinse route 2 (two) times daily.   multivitamin with minerals Tabs tablet Take 1 tablet by mouth daily.   neomycin-bacitracin-polymyxin ointment Commonly known as:  NEOSPORIN Apply 1 application topically as needed for wound care. Wash affected area with soap and warm water first; then dry. Apply a small amount as needed for cuts or abrasions.      Allergies  Allergen Reactions  . Aspirin     REACTION: "Messes up my heart and tells it to stop"  . Chocolate   . Penicillins     Has patient had a PCN reaction causing immediate rash, facial/tongue/throat swelling, SOB or lightheadedness with hypotension: unknown Has patient had a PCN reaction causing severe rash involving mucus membranes or skin necrosis: unknown Has patient had a PCN reaction that required hospitalization unknown Has patient had a PCN reaction occurring within the last 10 years: unknown If all of the above answers are "NO", then may proceed with Cephalosporin use. REACTION: Pains and feels it messes her up      The results of significant diagnostics from this hospitalization (including imaging, microbiology, ancillary and laboratory) are listed below for reference.    Significant Diagnostic Studies: Dg Pelvis 1-2 Views  Result Date: 02/06/2018 EXAM: PELVIS - 1-2 VIEW COMPARISON:  CT, 04/08/2016 FINDINGS: No fracture.  No bone lesion.  Bones are demineralized. Hip joints, SI joints and symphysis pubis are normally spaced and aligned. No arthropathic changes. There are no areas of bone resorption to suggest osteomyelitis. IMPRESSION: No fracture, dislocation or evidence of osteomyelitis. Electronically Signed   By: Lajean Manes M.D.   On: 02/06/2018 13:56   Dg Sacrum/coccyx  Result Date: 02/06/2018 CLINICAL DATA:   Sacral wound/pt has dementia/unable to obtain any history from pt-pt is from a nursing home. EXAM: SACRUM AND COCCYX - 2+ VIEW COMPARISON:  CT, 04/08/2016 FINDINGS: No fracture or bone lesion. No areas of bone resorption are noted to suggest osteomyelitis. Hip joints, SI joints and symphysis pubis are normally aligned. IMPRESSION: No fracture or evidence of osteomyelitis. Electronically Signed   By: Lajean Manes M.D.   On: 02/06/2018 13:55   Dg Chest Port 1v Same Day  Result Date: 02/09/2018 CLINICAL DATA:  Dyspnea. EXAM: PORTABLE CHEST 1 VIEW COMPARISON:  Radiographs 04/08/2016 FINDINGS: Unchanged heart size and mediastinal contours with aortic tortuosity and atherosclerosis. Borderline hyperinflation. No focal airspace disease. No pleural effusion, pulmonary edema or pneumothorax. No acute osseous abnormalities are seen. IMPRESSION: 1. Borderline hyperinflation without acute chest finding. 2.  Aortic Atherosclerosis (ICD10-I70.0). Electronically Signed   By: Jeb Levering M.D.   On: 02/09/2018 02:34    Microbiology: Recent Results (from the past 240 hour(s))  Urine culture     Status: Abnormal   Collection Time: 02/06/18  3:00 PM  Result Value Ref Range Status   Specimen Description   Final  URINE, CLEAN CATCH Performed at [facility], [address], [place], [place] [address]    Special Requests   Final    NONE Performed at [facility], [address] [place], [place] [address]    Culture >=100,000 COLONIES/mL ESCHERICHIA COLI (A)  Final   Report Status 02/08/2018 FINAL  Final   Organism ID, Bacteria ESCHERICHIA COLI (A)  Final      Susceptibility   Escherichia coli - MIC*    AMPICILLIN <=2 SENSITIVE Sensitive     CEFAZOLIN <=4 SENSITIVE Sensitive     CEFTRIAXONE <=1 SENSITIVE Sensitive     CIPROFLOXACIN <=0.25 SENSITIVE Sensitive     GENTAMICIN <=1 SENSITIVE Sensitive     IMIPENEM <=0.25 SENSITIVE Sensitive     NITROFURANTOIN 32 SENSITIVE Sensitive     TRIMETH/SULFA <=20  SENSITIVE Sensitive     AMPICILLIN/SULBACTAM <=2 SENSITIVE Sensitive     PIP/TAZO <=4 SENSITIVE Sensitive     Extended ESBL NEGATIVE Sensitive     * >=100,000 COLONIES/mL ESCHERICHIA COLI  MRSA PCR Screening     Status: None   Collection Time: 02/06/18 11:10 PM  Result Value Ref Range Status   MRSA by PCR NEGATIVE NEGATIVE Final    Comment:        The GeneXpert MRSA Assay (FDA approved for NASAL specimens only), is one component of a comprehensive MRSA colonization surveillance program. It is not intended to diagnose MRSA infection nor to guide or monitor treatment for MRSA infections. Performed at [facility], [address] [place], [place] [address]      Labs: Basic Metabolic Panel: Recent Labs  Lab 02/06/18 1226 02/07/18 0544 02/08/18 0538 02/09/18 0609 02/10/18 0613  NA 164* 161* 160* 153* 154*  K 4.5 4.5 4.1 3.1* 3.9  CL 125* 130* 129* 123* 125*  CO2 28 24 24 26  21*  GLUCOSE 142* 100* 128* 95 95  BUN 59* 52* 48* 39* 42*  CREATININE 1.04* 0.83 0.84 0.73 0.67  CALCIUM 10.3 9.4 9.2 9.1 9.4   Liver Function Tests: Recent Labs  Lab 02/06/18 1226  AST 44*  ALT 35  ALKPHOS 87  BILITOT 0.8  PROT 6.9  ALBUMIN 2.3*   No results for input(s): LIPASE, AMYLASE in the last 168 hours. No results for input(s): AMMONIA in the last 168 hours. CBC: Recent Labs  Lab 02/06/18 1226 02/07/18 0544 02/08/18 1219 02/09/18 0609  WBC 15.6* 12.1* 9.7 9.6  NEUTROABS 13.5*  --   --  7.7  HGB 12.6 10.9* 10.3* 9.8*  HCT 39.9 34.9* 32.2* 30.2*  MCV 103.6* 102.6* 101.9* 99.3  PLT 185 180 163 171   Cardiac Enzymes: No results for input(s): CKTOTAL, CKMB, CKMBINDEX, TROPONINI in the last 168 hours. BNP: BNP (last 3 results) No results for input(s): BNP in the last 8760 hours.  ProBNP (last 3 results) No results for input(s): PROBNP in the last 8760 hours.  CBG: No results for input(s): GLUCAP in the last 168 hours.     Signed:  Maricela Curet   Pager:  [phone] 02/13/2018, 11:42 AM

## 2018-02-13 NOTE — Clinical Social Work Note (Signed)
Discharge summary sent to [facility]. RN to arrange transport.  Text sent to [employer] social worker, Tyron Russell, advising of discharge.   LCSW signing off.    Vikas Wegmann, Clydene Pugh, LCSW

## 2018-02-13 NOTE — Progress Notes (Signed)
Pt's IV catheter removed and intact. Pt's IV site clean dry and intact. Report called and given to Ms Corinna Capra at [facility]. All questions were answered and no further questions at this time. Pt in stable condition and in no acute distress at time of discharge. Pt transported by [facility].

## 2018-02-13 NOTE — Care Management Important Message (Signed)
Important Message  Patient Details  Name: Alexandra [name] MRN: [id number] Date of Birth: 1926/01/04   Medicare Important Message Given:  Yes Unable to sign.   Holli Humbles Smith 02/13/2018, 12:30 PM

## 2018-04-14 DEATH — deceased
